# Patient Record
Sex: Female | Born: 1985 | Race: White | Hispanic: No | Marital: Married | Smoking: Former smoker
Health system: Southern US, Community
[De-identification: ages and names within clinical notes are randomized; demographics above are authoritative.]

## PROBLEM LIST (undated history)

## (undated) DIAGNOSIS — M810 Age-related osteoporosis without current pathological fracture: Secondary | ICD-10-CM

## (undated) DIAGNOSIS — F411 Generalized anxiety disorder: Secondary | ICD-10-CM

## (undated) DIAGNOSIS — M1990 Unspecified osteoarthritis, unspecified site: Secondary | ICD-10-CM

## (undated) DIAGNOSIS — K219 Gastro-esophageal reflux disease without esophagitis: Secondary | ICD-10-CM

## (undated) DIAGNOSIS — R55 Syncope and collapse: Secondary | ICD-10-CM

## (undated) DIAGNOSIS — M419 Scoliosis, unspecified: Secondary | ICD-10-CM

## (undated) DIAGNOSIS — D689 Coagulation defect, unspecified: Secondary | ICD-10-CM

## (undated) DIAGNOSIS — IMO0002 Reserved for concepts with insufficient information to code with codable children: Secondary | ICD-10-CM

## (undated) DIAGNOSIS — I219 Acute myocardial infarction, unspecified: Secondary | ICD-10-CM

## (undated) DIAGNOSIS — F32A Depression, unspecified: Secondary | ICD-10-CM

## (undated) DIAGNOSIS — N189 Chronic kidney disease, unspecified: Secondary | ICD-10-CM

## (undated) DIAGNOSIS — I10 Essential (primary) hypertension: Secondary | ICD-10-CM

## (undated) DIAGNOSIS — J449 Chronic obstructive pulmonary disease, unspecified: Secondary | ICD-10-CM

## (undated) DIAGNOSIS — I639 Cerebral infarction, unspecified: Secondary | ICD-10-CM

## (undated) DIAGNOSIS — F4310 Post-traumatic stress disorder, unspecified: Secondary | ICD-10-CM

## (undated) DIAGNOSIS — I499 Cardiac arrhythmia, unspecified: Secondary | ICD-10-CM

## (undated) DIAGNOSIS — T7840XA Allergy, unspecified, initial encounter: Secondary | ICD-10-CM

## (undated) DIAGNOSIS — G43909 Migraine, unspecified, not intractable, without status migrainosus: Secondary | ICD-10-CM

## (undated) DIAGNOSIS — J45909 Unspecified asthma, uncomplicated: Secondary | ICD-10-CM

## (undated) DIAGNOSIS — M797 Fibromyalgia: Secondary | ICD-10-CM

## (undated) DIAGNOSIS — G8929 Other chronic pain: Secondary | ICD-10-CM

## (undated) HISTORY — DX: Unspecified asthma, uncomplicated: J45.909

## (undated) HISTORY — DX: Syncope and collapse: R55

## (undated) HISTORY — DX: Depression, unspecified: F32.A

## (undated) HISTORY — DX: Age-related osteoporosis without current pathological fracture: M81.0

## (undated) HISTORY — DX: Allergy, unspecified, initial encounter: T78.40XA

## (undated) HISTORY — DX: Coagulation defect, unspecified: D68.9

## (undated) HISTORY — DX: Unspecified osteoarthritis, unspecified site: M19.90

## (undated) HISTORY — DX: Chronic kidney disease, unspecified: N18.9

## (undated) HISTORY — PX: CHOLECYSTECTOMY: SHX55

## (undated) HISTORY — DX: Cerebral infarction, unspecified: I63.9

## (undated) HISTORY — DX: Essential (primary) hypertension: I10

## (undated) HISTORY — PX: TUBAL LIGATION: SHX77

## (undated) HISTORY — DX: Chronic obstructive pulmonary disease, unspecified: J44.9

## (undated) HISTORY — DX: Cardiac arrhythmia, unspecified: I49.9

## (undated) HISTORY — DX: Gastro-esophageal reflux disease without esophagitis: K21.9

## (undated) HISTORY — DX: Acute myocardial infarction, unspecified: I21.9

## (undated) HISTORY — DX: Reserved for concepts with insufficient information to code with codable children: IMO0002

---
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
## 2009-12-16 DIAGNOSIS — F4312 Post-traumatic stress disorder, chronic: Secondary | ICD-10-CM | POA: Insufficient documentation

## 2015-03-13 DIAGNOSIS — F410 Panic disorder [episodic paroxysmal anxiety] without agoraphobia: Secondary | ICD-10-CM | POA: Insufficient documentation

## 2015-03-13 DIAGNOSIS — F488 Other specified nonpsychotic mental disorders: Secondary | ICD-10-CM | POA: Insufficient documentation

## 2015-03-13 DIAGNOSIS — G47419 Narcolepsy without cataplexy: Secondary | ICD-10-CM | POA: Insufficient documentation

## 2016-08-06 DIAGNOSIS — F329 Major depressive disorder, single episode, unspecified: Secondary | ICD-10-CM | POA: Insufficient documentation

## 2017-05-26 DIAGNOSIS — G43709 Chronic migraine without aura, not intractable, without status migrainosus: Secondary | ICD-10-CM | POA: Insufficient documentation

## 2017-07-03 LAB — HM PAP SMEAR: HM Pap smear: NORMAL

## 2021-02-03 ENCOUNTER — Other Ambulatory Visit: Payer: Self-pay

## 2021-02-03 ENCOUNTER — Emergency Department (INDEPENDENT_AMBULATORY_CARE_PROVIDER_SITE_OTHER)
Admission: EM | Admit: 2021-02-03 | Discharge: 2021-02-03 | Disposition: A | Discharge status: Home / Self Care | Payer: Managed Care, Other (non HMO) | Source: Home / Self Care | Attending: Family Medicine | Admitting: Family Medicine

## 2021-02-03 ENCOUNTER — Emergency Department: Payer: Managed Care, Other (non HMO)

## 2021-02-03 DIAGNOSIS — S59912A Unspecified injury of left forearm, initial encounter: Secondary | ICD-10-CM

## 2021-02-03 DIAGNOSIS — S5012XA Contusion of left forearm, initial encounter: Secondary | ICD-10-CM | POA: Diagnosis not present

## 2021-02-03 DIAGNOSIS — S6992XA Unspecified injury of left wrist, hand and finger(s), initial encounter: Secondary | ICD-10-CM | POA: Diagnosis not present

## 2021-02-03 DIAGNOSIS — M778 Other enthesopathies, not elsewhere classified: Secondary | ICD-10-CM

## 2021-02-03 HISTORY — DX: Scoliosis, unspecified: M41.9

## 2021-02-03 HISTORY — DX: Post-traumatic stress disorder, unspecified: F43.10

## 2021-02-03 HISTORY — DX: Fibromyalgia: M79.7

## 2021-02-03 HISTORY — DX: Generalized anxiety disorder: F41.1

## 2021-02-03 HISTORY — DX: Migraine, unspecified, not intractable, without status migrainosus: G43.909

## 2021-02-03 HISTORY — DX: Other chronic pain: G89.29

## 2021-02-03 MED ORDER — PREDNISONE 20 MG PO TABS: ORAL_TABLET | ORAL | 0 refills | Status: DC

## 2021-02-03 NOTE — Discharge Instructions (Addendum)
May continue to apply ice pack once or twice daily.  Elevate.  Wear brace for about 10 to 14 days.  Begin range of motion and stretching exercises as tolerated.

## 2021-02-03 NOTE — ED Provider Notes (Signed)
[facility]    CSN: [id number] Arrival date & time: 02/03/21  1038      History   Chief Complaint Chief Complaint  Patient presents with   Arm Injury    Left arm injury. X2 weeks    HPI Beth Lane is a 35 y.o. female.   Patient reports that the top edge of a heavy chest of drawers fell against the patient's left forearm and wrist two weeks ago.  She continues to have pain with wrist movement although the swelling has decreased.  The history is provided by the patient.  Arm Injury Location:  Arm and wrist Arm location:  L forearm Wrist location:  L wrist Injury: yes   Mechanism of injury: crush   Crush injury:    Mechanism: falling furniture. Pain details:    Quality:  Aching   Radiates to:  L wrist   Severity:  Moderate   Onset quality:  Sudden   Duration:  2 weeks   Timing:  Constant   Progression:  Unchanged Handedness:  Right-handed Prior injury to area:  No Worsened by:  Movement Ineffective treatments:  Ice Associated symptoms: decreased range of motion, muscle weakness, numbness and swelling    Past Medical History:  Diagnosis Date   Chronic pain    Fibromyalgia    Generalized anxiety disorder    Migraine syndrome    PTSD (post-traumatic stress disorder)    Scoliosis     There are no problems to display for this patient.   Past Surgical History:  Procedure Laterality Date   CESAREAN SECTION     CHOLECYSTECTOMY      OB History   No obstetric history on file.      Home Medications    Prior to Admission medications   Medication Sig Start Date End Date Taking? Authorizing Provider  albuterol (VENTOLIN HFA) 108 (90 Base) MCG/ACT inhaler Inhale into the lungs. 05/27/16  Yes [provider]  Diclofenac Potassium,Migraine, 50 MG PACK Take by mouth. 10/12/18  Yes [provider]  fluticasone-salmeterol (ADVAIR) 250-50 MCG/ACT AEPB USE 1 PUFF EVERY 12 HOURS 04/19/19  Yes [provider]   Fremanezumab-vfrm (AJOVY) 225 MG/1.5ML SOSY Inject into the skin. 10/12/18  Yes [provider]  methocarbamol (ROBAXIN) 750 MG tablet Take 750 mg by mouth every 6 (six) hours as needed. 01/09/21  Yes [provider]  Oxycodone HCl 10 MG TABS Take 10 mg by mouth every 6 (six) hours as needed. 01/16/21  Yes [provider]  PARoxetine (PAXIL) 30 MG tablet Take 30 mg by mouth daily. 01/09/21  Yes [provider]  predniSONE (DELTASONE) 20 MG tablet Take one tab by mouth twice daily for 4 days, then one daily. Take with food. 02/03/21  Yes Lattie Haw, MD  pregabalin (LYRICA) 100 MG capsule Take 100 mg by mouth every 8 (eight) hours. 01/18/21  Yes [provider]  VYVANSE 40 MG capsule Take 40 mg by mouth every morning. 09/05/20  Yes [provider]    Family History Family History  Problem Relation Age of Onset   Hypertension Mother    Cancer Mother    Bipolar disorder Mother    Fibromyalgia Mother    Stroke Father    Post-traumatic stress disorder Father    Heart attack Father     Social History Social History   Tobacco Use   Smoking status: Never   Smokeless tobacco: Never  Vaping Use   Vaping Use: Every day  Substance Use Topics   Alcohol use: Never   Drug use: Never     Allergies   Sumatriptan and Tape   Review of Systems Review of Systems  Constitutional: Negative.   Musculoskeletal:        Left wrist and forearm pain.  All other systems reviewed and are negative.   Physical Exam Triage Vital Signs ED Triage Vitals  Enc Vitals Group     BP 02/03/21 1408 117/85     Pulse Rate 02/03/21 1408 86     Resp 02/03/21 1408 18     Temp 02/03/21 1408 98.2 F (36.8 C)     Temp Source 02/03/21 1408 Oral     SpO2 02/03/21 1408 99 %     Weight 02/03/21 1402 248 lb (112.5 kg)     Height 02/03/21 1402 5\' 6"  (1.676 m)     Head Circumference --      Peak Flow --      Pain Score 02/03/21 1402 6     Pain Loc --       Pain Edu? --      Excl. in GC? --    No data found.  Updated Vital Signs BP 117/85 (BP Location: Right Arm)    Pulse 86    Temp 98.2 F (36.8 C) (Oral)    Resp 18    Ht 5\' 6"  (1.676 m)    Wt 112.5 kg    LMP 01/15/2021    SpO2 99%    BMI 40.03 kg/m   Visual Acuity Right Eye Distance:   Left Eye Distance:   Bilateral Distance:    Right Eye Near:   Left Eye Near:    Bilateral Near:     Physical Exam Vitals and nursing note reviewed.  Constitutional:      General: She is not in acute distress. HENT:     Head: Normocephalic.  Eyes:     Conjunctiva/sclera: Conjunctivae normal.     Pupils: Pupils are equal, round, and reactive to light.  Cardiovascular:     Rate and Rhythm: Normal rate.  Pulmonary:     Effort: Pulmonary effort is normal.  Musculoskeletal:       Arms:       Hands:     Comments: Left distal forearm has tenderness to palpation along its radial aspect.  Tenderness extends to the proximal thumb extensor tendons.  Left wrist has mildly decreased range of motion.  Distal neurovascular function is intact.   Skin:    General: Skin is warm and dry.     Findings: No rash.  Neurological:     General: No focal deficit present.     Mental Status: She is alert.     UC Treatments / Results  Labs (all labs ordered are listed, but only abnormal results are displayed) Labs Reviewed - No data to display  EKG   Radiology DG Forearm Left  Result Date: 02/03/2021 CLINICAL DATA:  Arm injury EXAM: LEFT FOREARM - 2 VIEW COMPARISON:  None. FINDINGS: There is no evidence of fracture or other focal bone lesions. Soft tissues are unremarkable. IMPRESSION: No acute osseous abnormality identified. Electronically Signed   By: 14/02/2020 M.D.   On: 02/03/2021 15:09   DG Wrist Complete Left  Result Date: 02/03/2021 CLINICAL DATA:  Arm injury EXAM: LEFT WRIST - COMPLETE 3+ VIEW COMPARISON:  None. FINDINGS: There is no evidence of fracture or dislocation. There is no  evidence of arthropathy or other focal bone  abnormality. Soft tissues are unremarkable. IMPRESSION: No acute osseous abnormality identified. Electronically Signed   By: Jannifer Hick M.D.   On: 02/03/2021 15:08    Procedures Procedures (including critical care time)  Medications Ordered in UC Medications - No data to display  Initial Impression / Assessment and Plan / UC Course  I have reviewed the triage vital signs and the nursing notes.  Pertinent labs & imaging results that were available during my care of the patient were reviewed by me and considered in my medical decision making (see chart for details).    Dispensed thumb spica splint.  Begin prednisone burst/taper. Given treatment instructions with range of motion and stretching exercises.  Followup with Dr. Rodney Langton ([facility]) if not improving about two weeks.   Final Clinical Impressions(s) / UC Diagnoses   Final diagnoses:  Contusion of left forearm, initial encounter  Tendonitis of wrist, left     Discharge Instructions      May continue to apply ice pack once or twice daily.  Elevate.  Wear brace for about 10 to 14 days.  Begin range of motion and stretching exercises as tolerated.     ED Prescriptions     Medication Sig Dispense Auth. Provider   predniSONE (DELTASONE) 20 MG tablet Take one tab by mouth twice daily for 4 days, then one daily. Take with food. 12 tablet Lattie Haw, MD         Lattie Haw, MD 02/05/21 [id number]

## 2021-02-03 NOTE — ED Triage Notes (Signed)
Pt states that a dresser fell on her left arm 2 weeks ago.   Pt states that she can't lift anything with left arm.

## 2021-03-08 DIAGNOSIS — J45909 Unspecified asthma, uncomplicated: Secondary | ICD-10-CM | POA: Insufficient documentation

## 2021-03-11 ENCOUNTER — Ambulatory Visit: Payer: Managed Care, Other (non HMO)

## 2021-03-16 ENCOUNTER — Ambulatory Visit (INDEPENDENT_AMBULATORY_CARE_PROVIDER_SITE_OTHER): Payer: Managed Care, Other (non HMO) | Admitting: Medical-Surgical

## 2021-03-16 ENCOUNTER — Encounter: Payer: Self-pay | Admitting: Medical-Surgical

## 2021-03-16 ENCOUNTER — Other Ambulatory Visit: Payer: Self-pay

## 2021-03-16 VITALS — BP 118/79 | HR 80 | Resp 20 | Ht 66.0 in | Wt 250.0 lb

## 2021-03-16 DIAGNOSIS — M797 Fibromyalgia: Secondary | ICD-10-CM

## 2021-03-16 DIAGNOSIS — G47419 Narcolepsy without cataplexy: Secondary | ICD-10-CM

## 2021-03-16 DIAGNOSIS — J4540 Moderate persistent asthma, uncomplicated: Secondary | ICD-10-CM

## 2021-03-16 DIAGNOSIS — Z7689 Persons encountering health services in other specified circumstances: Secondary | ICD-10-CM | POA: Diagnosis not present

## 2021-03-16 DIAGNOSIS — G43909 Migraine, unspecified, not intractable, without status migrainosus: Secondary | ICD-10-CM | POA: Diagnosis not present

## 2021-03-16 DIAGNOSIS — Z01419 Encounter for gynecological examination (general) (routine) without abnormal findings: Secondary | ICD-10-CM

## 2021-03-16 DIAGNOSIS — F410 Panic disorder [episodic paroxysmal anxiety] without agoraphobia: Secondary | ICD-10-CM

## 2021-03-16 DIAGNOSIS — F4312 Post-traumatic stress disorder, chronic: Secondary | ICD-10-CM

## 2021-03-16 DIAGNOSIS — F429 Obsessive-compulsive disorder, unspecified: Secondary | ICD-10-CM

## 2021-03-16 DIAGNOSIS — F324 Major depressive disorder, single episode, in partial remission: Secondary | ICD-10-CM

## 2021-03-16 DIAGNOSIS — F411 Generalized anxiety disorder: Secondary | ICD-10-CM

## 2021-03-16 MED ORDER — PAROXETINE HCL 30 MG PO TABS: 30.0000 mg | ORAL_TABLET | Freq: Every day | ORAL | 1 refills | Status: DC

## 2021-03-16 MED ORDER — LISDEXAMFETAMINE DIMESYLATE 40 MG PO CAPS: 40.0000 mg | ORAL_CAPSULE | ORAL | 0 refills | Status: DC

## 2021-03-16 MED ORDER — FLUTICASONE-SALMETEROL 250-50 MCG/ACT IN AEPB: 1.0000 | INHALATION_SPRAY | Freq: Two times a day (BID) | RESPIRATORY_TRACT | 2 refills | Status: DC

## 2021-03-16 MED ORDER — PRAZOSIN HCL 1 MG PO CAPS: 3.0000 mg | ORAL_CAPSULE | Freq: Every day | ORAL | 1 refills | Status: DC

## 2021-03-16 MED ORDER — UBRELVY 50 MG PO TABS: 1.0000 | ORAL_TABLET | ORAL | 3 refills | Status: DC | PRN

## 2021-03-16 MED ORDER — AJOVY 225 MG/1.5ML ~~LOC~~ SOSY: 225.0000 mg | PREFILLED_SYRINGE | SUBCUTANEOUS | 2 refills | Status: DC

## 2021-03-16 MED ORDER — VYVANSE 40 MG PO CAPS: 40.0000 mg | ORAL_CAPSULE | Freq: Every morning | ORAL | 0 refills | Status: DC

## 2021-03-16 NOTE — Progress Notes (Signed)
New Patient Office Visit  Subjective:  Patient ID: Beth Lane, female    DOB: 20-Jul-1985  Age: 36 y.o. MRN: [id number]  CC:  Chief Complaint  Patient presents with   Establish Care     HPI Beth Lane presents to establish care. She is a pleasant 36 year old female who is accompanied by her wife. She has relocated to the area and is in need of establishing with all new providers.   ADHD/Narcolepsy- treating with Vyvanse 40mg  daily but has been out of this for a while. Notes that her anxiety is extremely high when she doesn't take the Vyvanse which leads to her increased vaping.   Mood- taking Paxil 30mg  daily but has also run our to this medication. Tolerates well without side effects. Has been on the medication long term and reports it usually works well for her. Uses Prazosin 3mg  nightly for PTSD. Denies SI/HI.  Migraines- previously followed by Neurology. Uses Ajovy injections monthly and Ubrelvy as needed. Has been out of both medications for a while.   Pain- has fibromyalgia and severe chronic pain. Has been seeing a pain clinic but it is now a 3 hour drive away. Would like to get a referral to a pain clinic in this area. Using Oxycocone, Lyrica, and Robaxin as prescribed. Would like a referral to physical therapy. Doing exercises regularly but still having trouble with intractable pain.   Would like a referral to OB/GYN for her well woman care.   Past Medical History:  Diagnosis Date   Allergy    Imatrex and plastic tape   Arthritis    In knees, back, wrist, neck   Asthma    At age 71   Chronic pain    Clotting disorder (HCC)    Rectal bleeding at times, hemroids, ulcer   COPD (chronic obstructive pulmonary disease) (HCC)    Chronic bronchitis after being diagnosed with asthma at age 87   Depression    At age 7 but worse after my son died in 06-03-09   Fibromyalgia    Generalized anxiety disorder    GERD (gastroesophageal reflux disease)     Hypertension    About 4 years ago   Migraine syndrome    Osteoporosis    I tend to fracture bones easily and doctor wanted a bone density test in 06/04/10 but found out I was pregnant and was never followed up with   PTSD (post-traumatic stress disorder)    Scoliosis    Stroke (HCC)    Body reacted as a stroke but not an actual stroke. 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 Lost feeling and control on right side   Ulcer     Past Surgical History:  Procedure Laterality Date   CESAREAN SECTION     CHOLECYSTECTOMY     TUBAL LIGATION      Family History  Problem Relation Age of Onset   Hypertension Mother    Cancer Mother    Bipolar disorder Mother    Fibromyalgia Mother    ADD / ADHD Mother    Anxiety disorder Mother    Arthritis Mother    COPD Mother    Depression Mother    Diabetes Mother    Obesity Mother    Stroke Father    Post-traumatic stress disorder Father    Heart attack Father    ADD / ADHD Father    Anxiety disorder Father    Asthma Father    Alcohol abuse  Maternal Grandfather    ADD / ADHD Daughter    ADD / ADHD Son    Anxiety disorder Son    ADD / ADHD Brother    ADD / ADHD Sister    Anxiety disorder Son    Intellectual disability Son    Anxiety disorder Son    Hearing loss Son    Learning disabilities Son    Early death Son    Learning disabilities Son    Miscarriages / [place] Sister     Social History   Socioeconomic History   Marital status: Married    Spouse name: Not on file   Number of children: Not on file   Years of education: Not on file   Highest education level: Not on file  Occupational History   Not on file  Tobacco Use   Smoking status: Every Day    Types: E-cigarettes   Smokeless tobacco: Never  Vaping Use   Vaping Use: Every day  Substance and Sexual Activity   Alcohol use: Never   Drug use: Never   Sexual activity: Yes    Birth control/protection: Other-see comments    Comment: Lesbian  Other  Topics Concern   Not on file  Social History Narrative   Not on file   Social Determinants of Health   Financial Resource Strain: Not on file  Food Insecurity: Not on file  Transportation Needs: Not on file  Physical Activity: Not on file  Stress: Not on file  Social Connections: Not on file  Intimate Partner Violence: Not on file    ROS Review of Systems  Constitutional:  Negative for chills, fatigue, fever and unexpected weight change.  Respiratory:  Negative for cough, chest tightness and shortness of breath.   Cardiovascular:  Negative for chest pain, palpitations and leg swelling.  Gastrointestinal:  Negative for abdominal pain, constipation, diarrhea, nausea and vomiting.  Endocrine: Negative for cold intolerance and heat intolerance.  Genitourinary:  Positive for dysuria. Negative for frequency and urgency.  Skin:  Negative for rash and wound.  Neurological:  Negative for dizziness, light-headedness and headaches.  Hematological:  Does not bruise/bleed easily.  Psychiatric/Behavioral:  Positive for dysphoric mood. Negative for self-injury, sleep disturbance and suicidal ideas. The patient is nervous/anxious.    Objective:   Today's Vitals: BP 118/79 (BP Location: Left Wrist, Patient Position: Sitting, Cuff Size: Normal)    Pulse 80    Resp 20    Ht 5\' 6"  (1.676 m)    Wt 250 lb (113.4 kg)    SpO2 98%    BMI 40.35 kg/m   Physical Exam Vitals reviewed.  Constitutional:      General: She is not in acute distress.    Appearance: Normal appearance. She is obese. She is not ill-appearing.  HENT:     Head: Normocephalic and atraumatic.  Cardiovascular:     Rate and Rhythm: Normal rate and regular rhythm.     Pulses: Normal pulses.     Heart sounds: Normal heart sounds. No murmur heard.   No friction rub. No gallop.  Pulmonary:     Effort: Pulmonary effort is normal. No respiratory distress.     Breath sounds: Normal breath sounds. No wheezing.  Skin:    General: Skin  is warm and dry.  Neurological:     Mental Status: She is alert and oriented to person, place, and time.  Psychiatric:        Mood and Affect: Mood normal.  Behavior: Behavior normal.        Thought Content: Thought content normal.        Judgment: Judgment normal.    Assessment & Plan:   1. Encounter to establish care Reviewed available information and discussed care concerns with patient.   2. Primary narcolepsy without cataplexy Referrals as below.  Continue Vyvanse 40 mg daily.  Refills sent to pharmacy. - Ambulatory referral to Behavioral Health - Ambulatory referral to Psychiatry - Ambulatory referral to Neurology  3. Migraine syndrome Referring to neurology.  Refilling Ajovy and Bernita RaisinUbrelvy. - Ambulatory referral to Neurology  4. Major depressive disorder with single episode, in partial remission (HCC) 5. Chronic post-traumatic stress disorder (PTSD) 6. Obsessive-compulsive disorder, unspecified type 7. Generalized anxiety disorder with panic attacks Continue Paxil 30 mg daily and prazosin 3 mg nightly.  Referring to psychiatry as well as counseling per patient request. - Ambulatory referral to Behavioral Health - Ambulatory referral to Psychiatry  8. Fibromyalgia affecting multiple sites Referrals to pain clinic and physical therapy. - Ambulatory referral to Physical Therapy - Ambulatory referral to Pain Clinic  9. Well woman exam Referring to OB/GYN per patient request.  Advised her that we are able to do most well woman care here in our office but she is welcome to a specialist if desired. - Ambulatory referral to Obstetrics / Gynecology  10. Moderate persistent asthma without complication Continue Advair and albuterol as prescribed.  Refills sent to pharmacy.  Outpatient Encounter Medications as of 03/16/2021  Medication Sig   esomeprazole (NEXIUM) 40 MG capsule Take 40 mg by mouth daily at 12 noon.   fluticasone-salmeterol (ADVAIR DISKUS) 250-50 MCG/ACT  AEPB Inhale 1 puff into the lungs in the morning and at bedtime.   [START ON 04/15/2021] lisdexamfetamine (VYVANSE) 40 MG capsule Take 1 capsule (40 mg total) by mouth every morning.   [START ON 05/15/2021] lisdexamfetamine (VYVANSE) 40 MG capsule Take 1 capsule (40 mg total) by mouth every morning.   methocarbamol (ROBAXIN) 750 MG tablet Take 750 mg by mouth every 6 (six) hours as needed.   MOVANTIK 25 MG TABS tablet Take 25 mg by mouth every morning.   Oxycodone HCl 10 MG TABS Take 10 mg by mouth every 6 (six) hours as needed.   pregabalin (LYRICA) 100 MG capsule Take 100 mg by mouth every 8 (eight) hours.   Ubrogepant (UBRELVY) 50 MG TABS Take 1 tablet by mouth as needed (for migraine).   [DISCONTINUED] Fremanezumab-vfrm (AJOVY) 225 MG/1.5ML SOSY Inject into the skin.   [DISCONTINUED] PARoxetine (PAXIL) 30 MG tablet Take 30 mg by mouth daily.   [DISCONTINUED] prazosin (MINIPRESS) 1 MG capsule Take 3 mg by mouth at bedtime.   [DISCONTINUED] VYVANSE 40 MG capsule Take 40 mg by mouth every morning.   Fremanezumab-vfrm (AJOVY) 225 MG/1.5ML SOSY Inject 225 mg into the skin every 30 (thirty) days.   PARoxetine (PAXIL) 30 MG tablet Take 1 tablet (30 mg total) by mouth daily.   prazosin (MINIPRESS) 1 MG capsule Take 3 capsules (3 mg total) by mouth at bedtime.   VYVANSE 40 MG capsule Take 1 capsule (40 mg total) by mouth every morning.   [DISCONTINUED] albuterol (VENTOLIN HFA) 108 (90 Base) MCG/ACT inhaler Inhale into the lungs.   [DISCONTINUED] Diclofenac Potassium,Migraine, 50 MG PACK Take by mouth.   [DISCONTINUED] fluticasone-salmeterol (ADVAIR) 250-50 MCG/ACT AEPB USE 1 PUFF EVERY 12 HOURS   [DISCONTINUED] predniSONE (DELTASONE) 20 MG tablet Take one tab by mouth twice daily for 4 days, then one  daily. Take with food.   No facility-administered encounter medications on file as of 03/16/2021.    Follow-up: Return in about 3 months (around 06/14/2021) for chronic disease follow up.   Thayer OhmJoy L.  Oaklie Durrett, DNP, APRN, FNP-BC [facility]

## 2021-03-17 ENCOUNTER — Other Ambulatory Visit: Payer: Self-pay

## 2021-03-17 ENCOUNTER — Other Ambulatory Visit: Payer: Self-pay | Admitting: Medical-Surgical

## 2021-03-17 DIAGNOSIS — R300 Dysuria: Secondary | ICD-10-CM

## 2021-03-17 MED ORDER — NITROFURANTOIN MONOHYD MACRO 100 MG PO CAPS: 100.0000 mg | ORAL_CAPSULE | Freq: Two times a day (BID) | ORAL | 0 refills | Status: DC

## 2021-03-19 LAB — URINE CULTURE
MICRO NUMBER:: 12946640
SPECIMEN QUALITY:: ADEQUATE

## 2021-03-24 ENCOUNTER — Ambulatory Visit: Payer: Managed Care, Other (non HMO) | Attending: Medical-Surgical | Admitting: Physical Therapy

## 2021-03-25 ENCOUNTER — Telehealth: Payer: Self-pay

## 2021-03-25 NOTE — Telephone Encounter (Addendum)
Initiated Prior authorization [id number] (AJOVY) 225 MG/1.5ML SOSY Via: [place] [phone]) [id number] Status:approved as of 03/25/21 Reason: approval dates 03/25/2021-03/20/2022 Notified Pt via: Mychart [id number]

## 2021-03-27 NOTE — Telephone Encounter (Signed)
LVM for patient to call back to get appt scheduled.

## 2021-04-03 ENCOUNTER — Ambulatory Visit: Payer: Managed Care, Other (non HMO) | Admitting: Medical-Surgical

## 2021-04-06 ENCOUNTER — Other Ambulatory Visit: Payer: Self-pay

## 2021-04-06 ENCOUNTER — Ambulatory Visit: Payer: Managed Care, Other (non HMO)

## 2021-04-06 ENCOUNTER — Encounter: Payer: Self-pay | Admitting: Medical-Surgical

## 2021-04-06 ENCOUNTER — Ambulatory Visit (INDEPENDENT_AMBULATORY_CARE_PROVIDER_SITE_OTHER): Payer: Managed Care, Other (non HMO) | Admitting: Medical-Surgical

## 2021-04-06 ENCOUNTER — Other Ambulatory Visit: Payer: Self-pay | Admitting: Medical-Surgical

## 2021-04-06 VITALS — BP 118/80 | HR 102 | Temp 98.1°F | Ht 66.0 in | Wt 235.0 lb

## 2021-04-06 DIAGNOSIS — R109 Unspecified abdominal pain: Secondary | ICD-10-CM

## 2021-04-06 DIAGNOSIS — R300 Dysuria: Secondary | ICD-10-CM

## 2021-04-06 DIAGNOSIS — Z720 Tobacco use: Secondary | ICD-10-CM | POA: Insufficient documentation

## 2021-04-06 LAB — WET PREP FOR TRICH, YEAST, CLUE
MICRO NUMBER:: 13030955
Specimen Quality: ADEQUATE

## 2021-04-06 LAB — POCT URINALYSIS DIP (CLINITEK)
Bilirubin, UA: NEGATIVE
Blood, UA: NEGATIVE
Glucose, UA: NEGATIVE mg/dL
Nitrite, UA: POSITIVE — AB
Spec Grav, UA: >=1.03 — AB (ref 1.010–1.025)
Urobilinogen, UA: 0.2 E.U./dL
pH, UA: 5.5 (ref 5.0–8.0)

## 2021-04-06 MED ORDER — SULFAMETHOXAZOLE-TRIMETHOPRIM 800-160 MG PO TABS: 1.0000 | ORAL_TABLET | Freq: Two times a day (BID) | ORAL | 0 refills | Status: DC

## 2021-04-06 MED ORDER — ESOMEPRAZOLE MAGNESIUM 40 MG PO CPDR: 40.0000 mg | DELAYED_RELEASE_CAPSULE | Freq: Every day | ORAL | 2 refills | Status: DC

## 2021-04-06 NOTE — Progress Notes (Signed)
°  HPI with pertinent ROS:   CC: dysuria, back pain  HPI: Pleasant 36 year old female presenting today for evaluation of dysuria and back/flank pain.  Recently treated with Macrobid for a UTI, culture positive for E. coli showing sensitive to [place].  She completed her course of Macrobid and notes that her urinary symptoms did resolve but she is now left with a sharp stabbing pain when she initially starts urinating.  She has trouble emptying her bladder as well as some hesitancy, frequency, urgency, and foul odor to her urine.  Notes that she has seen her urine anywhere from yellow to dark orange with frequent color changes.  She has bilateral flank pain that is worse on the left.  Has been using a heating pad but is staying on it too long and now has discoloration of the skin along her lower back.  Denies fever, chills, shortness of breath, chest pain, or vaginal discharge.  History of kidney stones, no blood in urine noted.  I reviewed the past medical history, family history, social history, surgical history, and allergies today and no changes were needed.  Please see the problem list section below in epic for further details.  Physical exam:   General: Well Developed, well nourished, and in no acute distress.  Neuro: Alert and oriented x3.  HEENT: Normocephalic, atraumatic.  Skin: Warm and dry.  Darkened mottling along the lower back bilaterally, no blistering noted. Cardiac: Regular rate and rhythm, no murmurs rubs or gallops, no lower extremity edema.  Respiratory: Clear to auscultation bilaterally. Not using accessory muscles, speaking in full sentences.  Impression and Recommendations:    1. Dysuria 2. Flank pain POCT UA-positive for nitrites and small leukocytes, negative for blood, elevated specific gravity.  Trace protein.  Treating as pyelonephritis with Bactrim twice daily for 7 days.  May extend course to 14 days depending on culture results.  Sending for culture. Checking labs  and wet prep. Getting CT renal stone study STAT.  - POCT Urinalysis Dipstick - Urine Culture - CT RENAL STONE STUDY; Future - CBC with Differential - COMPLETE METABOLIC PANEL WITH GFR - WET PREP FOR TRICH, YEAST, CLUE  Return if symptoms worsen or fail to improve. ___________________________________________ Clearnce Sorrel, DNP, APRN, FNP-BC [facility] [place]

## 2021-04-06 NOTE — Addendum Note (Signed)
Addended byChristen Butter on: 04/06/2021 05:08 PM   Modules accepted: Orders

## 2021-04-07 ENCOUNTER — Encounter: Payer: Self-pay | Admitting: Medical-Surgical

## 2021-04-07 ENCOUNTER — Other Ambulatory Visit: Payer: Self-pay | Admitting: Medical-Surgical

## 2021-04-07 DIAGNOSIS — K429 Umbilical hernia without obstruction or gangrene: Secondary | ICD-10-CM

## 2021-04-07 LAB — COMPLETE METABOLIC PANEL WITH GFR
AG Ratio: 1.6 (calc) (ref 1.0–2.5)
ALT: 13 U/L (ref 6–29)
AST: 11 U/L (ref 10–30)
Albumin: 4.2 g/dL (ref 3.6–5.1)
Alkaline phosphatase (APISO): 59 U/L (ref 31–125)
BUN: 13 mg/dL (ref 7–25)
CO2: 28 mmol/L (ref 20–32)
Calcium: 9.4 mg/dL (ref 8.6–10.2)
Chloride: 106 mmol/L (ref 98–110)
Creat: 0.71 mg/dL (ref 0.50–0.97)
Globulin: 2.6 g/dL (calc) (ref 1.9–3.7)
Glucose, Bld: 77 mg/dL (ref 65–99)
Potassium: 4 mmol/L (ref 3.5–5.3)
Sodium: 141 mmol/L (ref 135–146)
Total Bilirubin: 0.5 mg/dL (ref 0.2–1.2)
Total Protein: 6.8 g/dL (ref 6.1–8.1)
eGFR: 115 mL/min/{1.73_m2} (ref >=60)

## 2021-04-07 LAB — CBC WITH DIFFERENTIAL/PLATELET
Absolute Monocytes: 406 cells/uL (ref 200–950)
Basophils Absolute: 42 cells/uL (ref 0–200)
Basophils Relative: 0.8 %
Eosinophils Absolute: 42 cells/uL (ref 15–500)
Eosinophils Relative: 0.8 %
HCT: 38.3 % (ref 35.0–45.0)
Hemoglobin: 13.1 g/dL (ref 11.7–15.5)
Lymphs Abs: 2356 cells/uL (ref 850–3900)
MCH: 31.1 pg (ref 27.0–33.0)
MCHC: 34.2 g/dL (ref 32.0–36.0)
MCV: 91 fL (ref 80.0–100.0)
MPV: 11.6 fL (ref 7.5–12.5)
Monocytes Relative: 7.8 %
Neutro Abs: 2356 cells/uL (ref 1500–7800)
Neutrophils Relative %: 45.3 %
Platelets: 246 10*3/uL (ref 140–400)
RBC: 4.21 10*6/uL (ref 3.80–5.10)
RDW: 13 % (ref 11.0–15.0)
Total Lymphocyte: 45.3 %
WBC: 5.2 10*3/uL (ref 3.8–10.8)

## 2021-04-07 MED ORDER — METRONIDAZOLE 500 MG PO TABS: 500.0000 mg | ORAL_TABLET | Freq: Two times a day (BID) | ORAL | 0 refills | Status: AC

## 2021-04-07 NOTE — Addendum Note (Signed)
Addended byChristen Butter on: 04/07/2021 07:12 AM   Modules accepted: Orders

## 2021-04-08 LAB — URINE CULTURE
MICRO NUMBER:: 13034736
SPECIMEN QUALITY:: ADEQUATE

## 2021-04-10 ENCOUNTER — Encounter: Payer: Self-pay | Admitting: Medical-Surgical

## 2021-04-10 ENCOUNTER — Telehealth: Payer: Self-pay

## 2021-04-10 DIAGNOSIS — F4312 Post-traumatic stress disorder, chronic: Secondary | ICD-10-CM

## 2021-04-10 DIAGNOSIS — R300 Dysuria: Secondary | ICD-10-CM

## 2021-04-10 DIAGNOSIS — F324 Major depressive disorder, single episode, in partial remission: Secondary | ICD-10-CM

## 2021-04-10 DIAGNOSIS — R109 Unspecified abdominal pain: Secondary | ICD-10-CM

## 2021-04-10 DIAGNOSIS — F429 Obsessive-compulsive disorder, unspecified: Secondary | ICD-10-CM

## 2021-04-10 DIAGNOSIS — F410 Panic disorder [episodic paroxysmal anxiety] without agoraphobia: Secondary | ICD-10-CM

## 2021-04-10 NOTE — Telephone Encounter (Addendum)
Initiated Prior authorization [id number] 50MG  tablets Via:[place] tracks Case/Key:[id number] Status: denied  as of 04/10/21 Reason:expedited , denied not meeting criteria based on pt insurance policy,denial letter was sent out via certified mail for specifics   Notified Pt via: Mychart

## 2021-04-14 ENCOUNTER — Encounter: Payer: Managed Care, Other (non HMO) | Admitting: Obstetrics and Gynecology

## 2021-04-16 ENCOUNTER — Telehealth: Payer: Self-pay

## 2021-04-16 NOTE — Telephone Encounter (Addendum)
Initiated Prior authorization [id number] (UBRELVY) 50 MG TABS ?Via: [place] tracks ?Case/Key:[id number] ?Status: approved as of 04/16/21 ?Reason: ?Notified Pt via: Mychart ?

## 2021-04-21 ENCOUNTER — Other Ambulatory Visit: Payer: Self-pay | Admitting: Medical-Surgical

## 2021-04-21 ENCOUNTER — Encounter: Payer: Self-pay | Admitting: Medical-Surgical

## 2021-04-21 ENCOUNTER — Ambulatory Visit (INDEPENDENT_AMBULATORY_CARE_PROVIDER_SITE_OTHER): Payer: Managed Care, Other (non HMO) | Admitting: Sports Medicine

## 2021-04-21 ENCOUNTER — Ambulatory Visit (INDEPENDENT_AMBULATORY_CARE_PROVIDER_SITE_OTHER): Payer: Managed Care, Other (non HMO) | Admitting: Medical-Surgical

## 2021-04-21 ENCOUNTER — Other Ambulatory Visit: Payer: Self-pay

## 2021-04-21 DIAGNOSIS — N390 Urinary tract infection, site not specified: Secondary | ICD-10-CM

## 2021-04-21 DIAGNOSIS — M797 Fibromyalgia: Secondary | ICD-10-CM | POA: Diagnosis not present

## 2021-04-21 DIAGNOSIS — M5030 Other cervical disc degeneration, unspecified cervical region: Secondary | ICD-10-CM

## 2021-04-21 MED ORDER — AMOXICILLIN-POT CLAVULANATE 875-125 MG PO TABS: 1.0000 | ORAL_TABLET | Freq: Two times a day (BID) | ORAL | 0 refills | Status: DC

## 2021-04-21 MED ORDER — CEFTRIAXONE SODIUM 1 G IJ SOLR
1.0000 g | Freq: Once | INTRAMUSCULAR | Status: AC
  Administered 2021-04-21: 1 g via INTRAMUSCULAR

## 2021-04-21 MED ORDER — METHOCARBAMOL 750 MG PO TABS: 750.0000 mg | ORAL_TABLET | Freq: Four times a day (QID) | ORAL | 3 refills | Status: DC | PRN

## 2021-04-21 MED ORDER — CEFTRIAXONE SODIUM 1 G IJ SOLR: 1.0000 g | Freq: Once | INTRAMUSCULAR | Status: DC

## 2021-04-21 MED ORDER — PREGABALIN 150 MG PO CAPS: 150.0000 mg | ORAL_CAPSULE | Freq: Three times a day (TID) | ORAL | 0 refills | Status: DC

## 2021-04-21 NOTE — Assessment & Plan Note (Signed)
This is a pleasant 36 year old female, she has had longstanding back pain for decades. ?She is working with a pain clinic, her pain has been moderately controlled with oxycodone, Lyrica, Robaxin. ?She did have a lumbar spine MRI recently that was completely unremarkable with the exception of some mild scoliosis. ?Scoliosis itself does not cause pain, but degenerative changes that can be seen alongside scoliosis can cause pain. ?In Chris's case there are no degenerative changes in the lumbar spine. ?She has also had multiple medications, facet injections, radiofrequency ablations, epidurals into the lumbar spine without improvement. ?I did explain the limitations in treatment of low back pain in the absence of structural abnormalities, and that the focus would be on neuropathic agents, core stabilization. ?I have advised her to focus aggressively on physical therapy, core conditioning, and avoidance of back braces. ?We will bump her Lyrica up to 150 mg 3 times daily, I will refill her Robaxin, she can continue to get her oxycodone prescriptions from her pain clinic. ?Return to see [place] in about a month. ?

## 2021-04-21 NOTE — Progress Notes (Signed)
? ?Established Patient Office Visit ? ?Subjective:  ?Patient ID: Beth Lane, female    DOB: [birth year]  Age: 36 y.o. MRN: [id number] ? ?CC:  ?Chief Complaint  ?Patient presents with  ? Injections  ? ? ?HPI ?Beth Lane presents for Rocephin injection.  ? ?Past Medical History:  ?Diagnosis Date  ? Allergy   ? Imatrex and plastic tape  ? Arthritis   ? In knees, back, wrist, neck  ? Asthma   ? At age 29  ? Chronic pain   ? Clotting disorder ([place])   ? Rectal bleeding at times, hemroids, ulcer  ? Depression   ? At age 65 but worse after my son died in 04/18/09  ? Fibromyalgia   ? Generalized anxiety disorder   ? GERD (gastroesophageal reflux disease)   ? Hypertension   ? About 4 years ago  ? Migraine syndrome   ? Osteoporosis   ? I tend to fracture bones easily and doctor wanted a bone density test in 2010/04/18 but found out I was pregnant and was never followed up with  ? PTSD (post-traumatic stress disorder)   ? Scoliosis   ? Stroke [facility]   ? Body reacted as a stroke but not an actual stroke. 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 Lost feeling and control on right side  ? Ulcer   ? ? ?Past Surgical History:  ?Procedure Laterality Date  ? CESAREAN SECTION    ? CHOLECYSTECTOMY    ? TUBAL LIGATION    ? ? ?Family History  ?Problem Relation Age of Onset  ? Hypertension Mother   ? Cancer Mother   ? Bipolar disorder Mother   ? Fibromyalgia Mother   ? ADD / ADHD Mother   ? Anxiety disorder Mother   ? Arthritis Mother   ? COPD Mother   ? Depression Mother   ? Diabetes Mother   ? Obesity Mother   ? Stroke Father   ? Post-traumatic stress disorder Father   ? Heart attack Father   ? ADD / ADHD Father   ? Anxiety disorder Father   ? Asthma Father   ? Alcohol abuse Maternal Grandfather   ? ADD / ADHD Daughter   ? ADD / ADHD Son   ? Anxiety disorder Son   ? ADD / ADHD Brother   ? ADD / ADHD Sister   ? Anxiety disorder Son   ? Intellectual disability Son   ? Anxiety disorder Son   ? Hearing loss Son    ? Learning disabilities Son   ? Early death Son   ? Learning disabilities Son   ? Miscarriages / Stillbirths Sister   ? ? ?Social History  ? ?Socioeconomic History  ? Marital status: Married  ?  Spouse name: Not on file  ? Number of children: Not on file  ? Years of education: Not on file  ? Highest education level: Not on file  ?Occupational History  ? Not on file  ?Tobacco Use  ? Smoking status: Every Day  ?  Types: E-cigarettes  ? Smokeless tobacco: Never  ?Vaping Use  ? Vaping Use: Every day  ?Substance and Sexual Activity  ? Alcohol use: Never  ? Drug use: Never  ? Sexual activity: Yes  ?  Birth control/protection: Other-see comments  ?  Comment: Lesbian  ?Other Topics Concern  ? Not on file  ?Social History Narrative  ? Not on file  ? ?Social Determinants of Health  ? ?Financial  Resource Strain: Not on file  ?Food Insecurity: Not on file  ?Transportation Needs: Not on file  ?Physical Activity: Not on file  ?Stress: Not on file  ?Social Connections: Not on file  ?Intimate Partner Violence: Not on file  ? ? ?Outpatient Medications Prior to Visit  ?Medication Sig Dispense Refill  ? amoxicillin-clavulanate (AUGMENTIN) 875-125 MG tablet Take 1 tablet by mouth 2 (two) times daily. 28 tablet 0  ? diclofenac Sodium (VOLTAREN) 1 % GEL SMARTSIG:2 Gram(s) Topical 3 Times Daily PRN    ? esomeprazole (NEXIUM) 40 MG capsule Take 1 capsule (40 mg total) by mouth daily at 12 noon. 30 capsule 2  ? fluticasone-salmeterol (ADVAIR DISKUS) 250-50 MCG/ACT AEPB Inhale 1 puff into the lungs in the morning and at bedtime. 60 each 2  ? Fremanezumab-vfrm (AJOVY) 225 MG/1.5ML SOSY Inject 225 mg into the skin every 30 (thirty) days. 1.5 mL 2  ? lidocaine-prilocaine (EMLA) cream SMARTSIG:2 Gram(s) Topical 3 Times Daily PRN    ? lisdexamfetamine (VYVANSE) 40 MG capsule Take 1 capsule (40 mg total) by mouth every morning. 30 capsule 0  ? [START ON 05/15/2021] lisdexamfetamine (VYVANSE) 40 MG capsule Take 1 capsule (40 mg total) by mouth  every morning. 30 capsule 0  ? methocarbamol (ROBAXIN) 750 MG tablet Take 1 tablet (750 mg total) by mouth every 6 (six) hours as needed. 360 tablet 3  ? MOVANTIK 25 MG TABS tablet Take 25 mg by mouth every morning.    ? Oxycodone HCl 10 MG TABS Take 10 mg by mouth every 6 (six) hours as needed.    ? PARoxetine (PAXIL) 30 MG tablet Take 1 tablet (30 mg total) by mouth daily. 90 tablet 1  ? prazosin (MINIPRESS) 1 MG capsule Take 3 capsules (3 mg total) by mouth at bedtime. 270 capsule 1  ? pregabalin (LYRICA) 150 MG capsule Take 1 capsule (150 mg total) by mouth 3 (three) times daily. 270 capsule 0  ? Ubrogepant (UBRELVY) 50 MG TABS Take 1 tablet by mouth as needed (for migraine). 10 tablet 3  ? VYVANSE 40 MG capsule Take 1 capsule (40 mg total) by mouth every morning. 30 capsule 0  ? cefTRIAXone (ROCEPHIN) injection 1 g     ? ?No facility-administered medications prior to visit.  ? ? ?Allergies  ?Allergen Reactions  ? Iodine Nausea And Vomiting  ?  Only allergic to Contrast media. Has severe vomiting ? ?  ? Sumatriptan Anaphylaxis  ? Tape Rash  ? ? ?ROS ?Review of Systems ? ?  ?Objective:  ?  ?Physical Exam ? ?There were no vitals taken for this visit. ?Wt Readings from Last 3 Encounters:  ?04/06/21 235 lb (106.6 kg)  ?03/16/21 250 lb (113.4 kg)  ?02/03/21 248 lb (112.5 kg)  ? ? ? ?There are no preventive care reminders to display for this patient. ? ?There are no preventive care reminders to display for this patient. ? ?No results found for: TSH ?Lab Results  ?Component Value Date  ? WBC 5.2 04/06/2021  ? HGB 13.1 04/06/2021  ? HCT 38.3 04/06/2021  ? MCV 91.0 04/06/2021  ? PLT 246 04/06/2021  ? ?Lab Results  ?Component Value Date  ? NA 141 04/06/2021  ? K 4.0 04/06/2021  ? CO2 28 04/06/2021  ? GLUCOSE 77 04/06/2021  ? BUN 13 04/06/2021  ? CREATININE 0.71 04/06/2021  ? BILITOT 0.5 04/06/2021  ? AST 11 04/06/2021  ? ALT 13 04/06/2021  ? PROT 6.8 04/06/2021  ? CALCIUM 9.4 04/06/2021  ?  EGFR 115 04/06/2021  ? ?No  results found for: CHOL ?No results found for: HDL ?No results found for: [place] ?No results found for: TRIG ?No results found for: CHOLHDL ?No results found for: HGBA1C ? ?  ?Assessment & Plan:  ?Rocephin injection - Patient tolerated injection well without complications. Patient advised to take the Augmentin as directed.  ? ?Problem List Items Addressed This Visit   ?None ?Visit Diagnoses   ? ? Complicated UTI (urinary tract infection)    -  Primary  ? Relevant Medications  ? cefTRIAXone (ROCEPHIN) injection 1 g (Completed)  ? ?  ? ? ?Meds ordered this encounter  ?Medications  ? cefTRIAXone (ROCEPHIN) injection 1 g  ? ? ?Follow-up: Return if symptoms worsen or fail to improve.  ? ? ?Lavell Luster, [place] ?

## 2021-04-21 NOTE — Assessment & Plan Note (Signed)
Beth Lane has neck pain with radiation into the right periscapular region. ?I think the majority of her pain is related to fibromyalgia however an MRI of the cervical spine from 2018 did show mild C5-T1 degenerative changes. ?For this reason we will try a right-sided C6-C7 interlaminar epidural. ?Continue Lyrica and Robaxin as below. ? ?

## 2021-04-21 NOTE — Progress Notes (Signed)
? ? ?  Procedures performed today:   ? ?None. ? ?Independent interpretation of notes and tests performed by another provider:  ? ?None. ? ?Brief History, Exam, Impression, and Recommendations:   ? ?Fibromyalgia affecting multiple sites ?This is a pleasant 36 year old female, she has had longstanding back pain for decades. ?She is working with a pain clinic, her pain has been moderately controlled with oxycodone, Lyrica, Robaxin. ?She did have a lumbar spine MRI recently that was completely unremarkable with the exception of some mild scoliosis. ?Scoliosis itself does not cause pain, but degenerative changes that can be seen alongside scoliosis can cause pain. ?In Beth Lane's case there are no degenerative changes in the lumbar spine. ?She has also had multiple medications, facet injections, radiofrequency ablations, epidurals into the lumbar spine without improvement. ?I did explain the limitations in treatment of low back pain in the absence of structural abnormalities, and that the focus would be on neuropathic agents, core stabilization. ?I have advised her to focus aggressively on physical therapy, core conditioning, and avoidance of back braces. ?We will bump her Lyrica up to 150 mg 3 times daily, I will refill her Robaxin, she can continue to get her oxycodone prescriptions from her pain clinic. ?Return to see [place] in about a month. ? ?DDD (degenerative disc disease), cervical ?Beth Lane has neck pain with radiation into the right periscapular region. ?I think the majority of her pain is related to fibromyalgia however an MRI of the cervical spine from 2018 did show mild C5-T1 degenerative changes. ?For this reason we will try a right-sided C6-C7 interlaminar epidural. ?Continue Lyrica and Robaxin as below. ? ?I spent 30 minutes of total time managing this patient today, this includes chart review, face to face, and non-face to face time.  Chronic process with exacerbation and pharmacologic  intervention ? ?___________________________________________ ?Ihor Austin. Benjamin Stain, M.D., ABFM., CAQSM. ?Primary Care and Sports Medicine ?[facility] ? ?Adjunct Instructor of Family Medicine  ?[employer] ?

## 2021-04-21 NOTE — Progress Notes (Signed)
Agree with documentation as above.  ? ?___________________________________________ ?Beth Haubner L. Ellaina Schuler, DNP, APRN, FNP-BC ?Primary Care and Sports Medicine ?[facility] ? ?

## 2021-04-22 ENCOUNTER — Encounter: Payer: Self-pay | Admitting: Sports Medicine

## 2021-04-27 ENCOUNTER — Encounter: Payer: Self-pay | Admitting: Sports Medicine

## 2021-04-27 DIAGNOSIS — M5030 Other cervical disc degeneration, unspecified cervical region: Secondary | ICD-10-CM

## 2021-04-29 ENCOUNTER — Ambulatory Visit
Admission: RE | Admit: 2021-04-29 | Discharge: 2021-04-29 | Disposition: A | Discharge status: Home / Self Care | Payer: Managed Care, Other (non HMO) | Source: Ambulatory Visit | Attending: Sports Medicine | Admitting: Sports Medicine

## 2021-04-29 ENCOUNTER — Other Ambulatory Visit: Payer: Self-pay

## 2021-04-29 DIAGNOSIS — M5030 Other cervical disc degeneration, unspecified cervical region: Secondary | ICD-10-CM

## 2021-04-29 MED ORDER — IOPAMIDOL (ISOVUE-M 300) INJECTION 61%
1.0000 mL | Freq: Once | INTRAMUSCULAR | Status: AC
Start: 2021-04-29 — End: 2021-04-29
  Administered 2021-04-29: 1 mL via EPIDURAL

## 2021-04-29 MED ORDER — TRIAMCINOLONE ACETONIDE 40 MG/ML IJ SUSP (RADIOLOGY)
60.0000 mg | Freq: Once | INTRAMUSCULAR | Status: AC
  Administered 2021-04-29: 60 mg via EPIDURAL

## 2021-04-29 NOTE — Discharge Instructions (Signed)

## 2021-05-11 ENCOUNTER — Encounter (INDEPENDENT_AMBULATORY_CARE_PROVIDER_SITE_OTHER): Payer: Self-pay

## 2021-05-11 NOTE — Addendum Note (Signed)
Addended byChristen Butter on: 05/11/2021 08:48 PM ? ? Modules accepted: Orders ? ?

## 2021-05-12 ENCOUNTER — Ambulatory Visit: Payer: Managed Care, Other (non HMO) | Admitting: Physical Therapy

## 2021-05-12 ENCOUNTER — Other Ambulatory Visit: Payer: Self-pay | Admitting: Medical-Surgical

## 2021-05-13 ENCOUNTER — Ambulatory Visit: Payer: Managed Care, Other (non HMO)

## 2021-05-14 ENCOUNTER — Encounter: Payer: Managed Care, Other (non HMO) | Admitting: Obstetrics and Gynecology

## 2021-05-18 ENCOUNTER — Ambulatory Visit: Payer: Managed Care, Other (non HMO) | Attending: Medical-Surgical | Admitting: Physical Therapy

## 2021-05-22 ENCOUNTER — Telehealth (HOSPITAL_COMMUNITY): Payer: Self-pay | Admitting: Licensed Clinical Social Worker

## 2021-05-22 NOTE — Telephone Encounter (Signed)
Need to r/s appt due to [facility] being out  ? ?06/18/2021 at [place] with [facility]  ? ?called on 4/7 at 11:28a to r/s this appt - not able to leave vm ?

## 2021-05-25 ENCOUNTER — Telehealth (HOSPITAL_COMMUNITY): Payer: Self-pay | Admitting: Licensed Clinical Social Worker

## 2021-05-25 ENCOUNTER — Encounter: Payer: Managed Care, Other (non HMO) | Admitting: Obstetrics and Gynecology

## 2021-05-25 NOTE — Telephone Encounter (Signed)
Beth Lane will be out of office on 5/4 ? ?I called on 4/7 at 11:28a to r/s this appt - not able to leave vm ? ?I called on 4/10 at 9:18a to r/s this appt - left a vm ?

## 2021-05-26 ENCOUNTER — Ambulatory Visit (INDEPENDENT_AMBULATORY_CARE_PROVIDER_SITE_OTHER): Payer: Managed Care, Other (non HMO) | Admitting: Sports Medicine

## 2021-05-26 ENCOUNTER — Ambulatory Visit: Payer: Managed Care, Other (non HMO) | Admitting: Physical Therapy

## 2021-05-26 DIAGNOSIS — M797 Fibromyalgia: Secondary | ICD-10-CM

## 2021-05-26 DIAGNOSIS — M5030 Other cervical disc degeneration, unspecified cervical region: Secondary | ICD-10-CM

## 2021-05-26 MED ORDER — PREGABALIN 200 MG PO CAPS: 200.0000 mg | ORAL_CAPSULE | Freq: Three times a day (TID) | ORAL | 3 refills | Status: DC

## 2021-05-26 NOTE — Assessment & Plan Note (Signed)
Beth Lane does have chronic pain, MRI of the lumbar spine was completely unremarkable, symptoms were relatively well controlled with oxycodone, Lyrica, Robaxin. ?Her pain doctor was 3 hours away so she has discontinued care with him, and has run out of oxycodone. ?She did not get along with the most recent pain provider at the [facility] so we will try another pain clinic. ?I do think that she will need chronic long-term narcotics, she was significantly more functional and comfortable on her oxycodone. ?In the meantime I will bump up her Lyrica to 200 mg 3 times daily. ?

## 2021-05-26 NOTE — Progress Notes (Signed)
? ? ?  Procedures performed today:   ? ?None. ? ?Independent interpretation of notes and tests performed by another provider:  ? ?None. ? ?Brief History, Exam, Impression, and Recommendations:   ? ?DDD (degenerative disc disease), cervical ?Chris's pain improved considerably with a cervical epidural, MRIs did show C5-T1 degenerative changes. ?There is also an element of fibromyalgia which has been improved to some degree with Lyrica and Robaxin. ? ?Fibromyalgia affecting multiple sites ?Thayer Ohm does have chronic pain, MRI of the lumbar spine was completely unremarkable, symptoms were relatively well controlled with oxycodone, Lyrica, Robaxin. ?Her pain doctor was 3 hours away so she has discontinued care with him, and has run out of oxycodone. ?She did not get along with the most recent pain provider at the [facility] so we will try another pain clinic. ?I do think that she will need chronic long-term narcotics, she was significantly more functional and comfortable on her oxycodone. ?In the meantime I will bump up her Lyrica to 200 mg 3 times daily. ? ?Chronic process with exacerbation and pharmacologic intervention ? ?___________________________________________ ?Ihor Austin. Benjamin Stain, M.D., ABFM., CAQSM. ?Primary Care and Sports Medicine ?[facility] ? ?Adjunct Instructor of Family Medicine  ?[employer] ?

## 2021-05-26 NOTE — Assessment & Plan Note (Signed)
Chris's pain improved considerably with a cervical epidural, MRIs did show C5-T1 degenerative changes. ?There is also an element of fibromyalgia which has been improved to some degree with Lyrica and Robaxin. ?

## 2021-05-27 ENCOUNTER — Encounter: Payer: Managed Care, Other (non HMO) | Admitting: Physical Therapy

## 2021-05-31 ENCOUNTER — Encounter: Payer: Self-pay | Admitting: Medical-Surgical

## 2021-06-01 ENCOUNTER — Encounter: Payer: Managed Care, Other (non HMO) | Admitting: Obstetrics & Gynecology

## 2021-06-01 ENCOUNTER — Ambulatory Visit (INDEPENDENT_AMBULATORY_CARE_PROVIDER_SITE_OTHER): Payer: 59 | Admitting: Licensed Clinical Social Worker

## 2021-06-01 ENCOUNTER — Ambulatory Visit: Payer: Managed Care, Other (non HMO) | Admitting: Physical Therapy

## 2021-06-01 ENCOUNTER — Encounter: Payer: Self-pay | Admitting: Sports Medicine

## 2021-06-01 ENCOUNTER — Other Ambulatory Visit: Payer: Self-pay

## 2021-06-01 ENCOUNTER — Encounter (HOSPITAL_BASED_OUTPATIENT_CLINIC_OR_DEPARTMENT_OTHER): Payer: Self-pay

## 2021-06-01 ENCOUNTER — Emergency Department (HOSPITAL_BASED_OUTPATIENT_CLINIC_OR_DEPARTMENT_OTHER)
Admission: EM | Admit: 2021-06-01 | Discharge: 2021-06-01 | Discharge status: Against Medical Advice | Payer: Managed Care, Other (non HMO) | Attending: Emergency Medicine | Admitting: Emergency Medicine

## 2021-06-01 DIAGNOSIS — R200 Anesthesia of skin: Secondary | ICD-10-CM

## 2021-06-01 DIAGNOSIS — M62838 Other muscle spasm: Secondary | ICD-10-CM

## 2021-06-01 DIAGNOSIS — F331 Major depressive disorder, recurrent, moderate: Secondary | ICD-10-CM | POA: Diagnosis not present

## 2021-06-01 DIAGNOSIS — R531 Weakness: Secondary | ICD-10-CM | POA: Diagnosis present

## 2021-06-01 DIAGNOSIS — F4310 Post-traumatic stress disorder, unspecified: Secondary | ICD-10-CM | POA: Diagnosis not present

## 2021-06-01 DIAGNOSIS — R202 Paresthesia of skin: Secondary | ICD-10-CM | POA: Diagnosis not present

## 2021-06-01 DIAGNOSIS — F411 Generalized anxiety disorder: Secondary | ICD-10-CM

## 2021-06-01 LAB — COMPREHENSIVE METABOLIC PANEL
ALT: 19 U/L (ref 0–44)
AST: 15 U/L (ref 15–41)
Albumin: 3.9 g/dL (ref 3.5–5.0)
Alkaline Phosphatase: 50 U/L (ref 38–126)
Anion gap: 7 (ref 5–15)
BUN: 16 mg/dL (ref 6–20)
CO2: 24 mmol/L (ref 22–32)
Calcium: 9 mg/dL (ref 8.9–10.3)
Chloride: 108 mmol/L (ref 98–111)
Creatinine, Ser: 0.71 mg/dL (ref 0.44–1.00)
GFR, Estimated: >60 mL/min (ref >60)
Glucose, Bld: 114 mg/dL — ABNORMAL HIGH (ref 70–99)
Potassium: 3.4 mmol/L — ABNORMAL LOW (ref 3.5–5.1)
Sodium: 139 mmol/L (ref 135–145)
Total Bilirubin: 0.6 mg/dL (ref 0.3–1.2)
Total Protein: 7.2 g/dL (ref 6.5–8.1)

## 2021-06-01 LAB — URINALYSIS, ROUTINE W REFLEX MICROSCOPIC
Bilirubin Urine: NEGATIVE
Glucose, UA: NEGATIVE mg/dL
Ketones, ur: NEGATIVE mg/dL
Nitrite: POSITIVE — AB
Protein, ur: NEGATIVE mg/dL
Specific Gravity, Urine: 1.025 (ref 1.005–1.030)
pH: 6 (ref 5.0–8.0)

## 2021-06-01 LAB — CBC WITH DIFFERENTIAL/PLATELET
Abs Immature Granulocytes: 0.02 10*3/uL (ref 0.00–0.07)
Basophils Absolute: 0 10*3/uL (ref 0.0–0.1)
Basophils Relative: 0 %
Eosinophils Absolute: 0 10*3/uL (ref 0.0–0.5)
Eosinophils Relative: 1 %
HCT: 37.3 % (ref 36.0–46.0)
Hemoglobin: 13.5 g/dL (ref 12.0–15.0)
Immature Granulocytes: 0 %
Lymphocytes Relative: 43 %
Lymphs Abs: 3 10*3/uL (ref 0.7–4.0)
MCH: 32.5 pg (ref 26.0–34.0)
MCHC: 36.2 g/dL — ABNORMAL HIGH (ref 30.0–36.0)
MCV: 89.7 fL (ref 80.0–100.0)
Monocytes Absolute: 0.4 10*3/uL (ref 0.1–1.0)
Monocytes Relative: 5 %
Neutro Abs: 3.6 10*3/uL (ref 1.7–7.7)
Neutrophils Relative %: 51 %
Platelets: 265 10*3/uL (ref 150–400)
RBC: 4.16 MIL/uL (ref 3.87–5.11)
RDW: 12.8 % (ref 11.5–15.5)
WBC: 7.1 10*3/uL (ref 4.0–10.5)
nRBC: 0 % (ref 0.0–0.2)

## 2021-06-01 LAB — URINALYSIS, MICROSCOPIC (REFLEX)

## 2021-06-01 LAB — RAPID URINE DRUG SCREEN, HOSP PERFORMED
Amphetamines: POSITIVE — AB
Barbiturates: NOT DETECTED
Benzodiazepines: NOT DETECTED
Cocaine: NOT DETECTED
Opiates: NOT DETECTED
Tetrahydrocannabinol: NOT DETECTED

## 2021-06-01 LAB — PREGNANCY, URINE: Preg Test, Ur: NEGATIVE

## 2021-06-01 LAB — MAGNESIUM: Magnesium: 2 mg/dL (ref 1.7–2.4)

## 2021-06-01 MED ORDER — SODIUM CHLORIDE 0.9 % IV SOLN: 1000.0000 mg | Freq: Once | INTRAVENOUS | Status: DC

## 2021-06-01 NOTE — ED Notes (Signed)
Pt. Reports struggling with this for the last 4 days  ?

## 2021-06-01 NOTE — ED Provider Notes (Signed)
?[facility]PART[facility] ?Provider Note ? ? ?CSN: [id number] ?Arrival date & time: 06/01/21  1904 ? ?  ? ?History ? ?Chief Complaint  ?Patient presents with  ? Spasms  ? ? ?Beth Lane is a 36 y.o. female. ? ?36 year old female presents with her partner for evaluation of generalized weakness, and muscle spasms of 3 to 4-day duration, associated with numbness.  Denies fever, chills.  She states she has had UTI for multiple weeks and has tried 3 different antibiotics without improvement in symptoms.  She is currently awaiting referral to urology.  She states her spasms got worse today and was unable to get in with her PCP so presented to the emergency room.  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  Without difficulty communicating, or facial droop. ? ?The history is provided by the patient. No language interpreter was used.  ? ?  ? ?Home Medications ?Prior to Admission medications   ?Medication Sig Start Date End Date Taking? Authorizing Provider  ?esomeprazole (NEXIUM) 40 MG capsule TAKE ONE CAPSULE BY MOUTH DAILY AT NOON 05/12/21   Samuel Bouche, NP  ?amoxicillin-clavulanate (AUGMENTIN) 875-125 MG tablet Take 1 tablet by mouth 2 (two) times daily. 04/21/21   Samuel Bouche, NP  ?diclofenac Sodium (VOLTAREN) 1 % GEL SMARTSIG:2 Gram(s) Topical 3 Times Daily PRN 03/16/21   [provider]  ?fluticasone-salmeterol (ADVAIR DISKUS) 250-50 MCG/ACT AEPB Inhale 1 puff into the lungs in the morning and at bedtime. 03/16/21   Samuel Bouche, NP  ?Fremanezumab-vfrm (AJOVY) 225 MG/1.5ML SOSY Inject 225 mg into the skin every 30 (thirty) days. 03/16/21   Samuel Bouche, NP  ?lidocaine-prilocaine (EMLA) cream SMARTSIG:2 Gram(s) Topical 3 Times Daily PRN 03/16/21   [provider]  ?lisdexamfetamine (VYVANSE) 40 MG capsule Take 1 capsule (40 mg total) by mouth every morning. 04/15/21   Samuel Bouche, NP  ?lisdexamfetamine  (VYVANSE) 40 MG capsule Take 1 capsule (40 mg total) by mouth every morning. 05/15/21   Samuel Bouche, NP  ?methocarbamol (ROBAXIN) 750 MG tablet Take 1 tablet (750 mg total) by mouth every 6 (six) hours as needed. 04/21/21   Silverio Decamp, MD  ?MOVANTIK 25 MG TABS tablet Take 25 mg by mouth every morning. 02/18/21   [provider]  ?Oxycodone HCl 10 MG TABS Take 10 mg by mouth every 6 (six) hours as needed. 01/16/21   [provider]  ?PARoxetine (PAXIL) 30 MG tablet Take 1 tablet (30 mg total) by mouth daily. 03/16/21   Samuel Bouche, NP  ?prazosin (MINIPRESS) 1 MG capsule Take 3 capsules (3 mg total) by mouth at bedtime. 03/16/21   Samuel Bouche, NP  ?pregabalin (LYRICA) 200 MG capsule Take 1 capsule (200 mg total) by mouth 3 (three) times daily. 05/26/21   Silverio Decamp, MD  ?Ubrogepant (UBRELVY) 50 MG TABS Take 1 tablet by mouth as needed (for migraine). 03/16/21   Samuel Bouche, NP  ?VYVANSE 40 MG capsule Take 1 capsule (40 mg total) by mouth every morning. 03/16/21   Samuel Bouche, NP  ?   ? ?Allergies    ?Iodine, Sumatriptan, and Tape   ? ?Review of Systems   ?Review of Systems  ?Constitutional:  Negative for chills and fever.  ?Eyes:  Negative for visual disturbance.  ?Respiratory:  Negative for shortness of breath.   ?Musculoskeletal:  Positive for gait problem.  ?Neurological:  Negative for weakness, light-headedness and  headaches.  ?All other systems reviewed and are negative. ? ?Physical Exam ?Updated Vital Signs ?BP (!) 170/83 (BP Location: Left Arm)   Pulse 83   Temp 98.1 ?F (36.7 ?C) (Oral)   Resp 20   Ht 5\' 6"  (1.676 m)   Wt 106.6 kg   LMP 05/16/2021 (Approximate)   SpO2 96%   BMI 37.93 kg/m?  ?Physical Exam ?Vitals and nursing note reviewed.  ?Constitutional:   ?   General: She is not in acute distress. ?   Appearance: Normal appearance. She is not ill-appearing.  ?HENT:  ?   Head: Normocephalic and atraumatic.  ?   Nose: Nose normal.  ?Eyes:  ?   General: No scleral  icterus. ?   Extraocular Movements: Extraocular movements intact.  ?   Conjunctiva/sclera: Conjunctivae normal.  ?Cardiovascular:  ?   Rate and Rhythm: Normal rate and regular rhythm.  ?   Pulses: Normal pulses.  ?   Heart sounds: Normal heart sounds.  ?Pulmonary:  ?   Effort: Pulmonary effort is normal. No respiratory distress.  ?   Breath sounds: Normal breath sounds. No wheezing or rales.  ?Abdominal:  ?   General: There is no distension.  ?   Tenderness: There is no abdominal tenderness.  ?Musculoskeletal:     ?   General: Normal range of motion.  ?   Cervical back: Normal range of motion.  ?Skin: ?   General: Skin is warm and dry.  ?Neurological:  ?   General: No focal deficit present.  ?   Mental Status: She is alert. Mental status is at baseline.  ?   Comments: Generalized muscle twitching and spasms noted at rest or with activity.  Generalized muscle weakness noted that is slightly worse on the right side.  Patient has 2+ patellar and brachial reflexes.  Patient is able to ambulate however with some difficulty.  Sensation intact in bilateral upper and lower extremities.  ? ? ?ED Results / Procedures / Treatments   ?Labs ?(all labs ordered are listed, but only abnormal results are displayed) ?Labs Reviewed  ?CBC WITH DIFFERENTIAL/PLATELET - Abnormal; Notable for the following components:  ?    Result Value  ? MCHC 36.2 (*)   ? All other components within normal limits  ?COMPREHENSIVE METABOLIC PANEL - Abnormal; Notable for the following components:  ? Potassium 3.4 (*)   ? Glucose, Bld 114 (*)   ? All other components within normal limits  ?URINALYSIS, ROUTINE W REFLEX MICROSCOPIC - Abnormal; Notable for the following components:  ? Hgb urine dipstick SMALL (*)   ? Nitrite POSITIVE (*)   ? Leukocytes,Ua TRACE (*)   ? All other components within normal limits  ?RAPID URINE DRUG SCREEN, HOSP PERFORMED - Abnormal; Notable for the following components:  ? Amphetamines POSITIVE (*)   ? All other components within  normal limits  ?URINALYSIS, MICROSCOPIC (REFLEX) - Abnormal; Notable for the following components:  ? Bacteria, UA MANY (*)   ? All other components within normal limits  ?MAGNESIUM  ?PREGNANCY, URINE  ? ? ?EKG ?None ? ?Radiology ?No results found. ? ?Procedures ?Procedures  ? ? ?Medications Ordered in ED ?Medications - No data to display ? ?ED Course/ Medical Decision Making/ A&P ?  ?                        ?Medical Decision Making ?Amount and/or Complexity of Data Reviewed ?Labs: ordered. ? ? ?36 year old female presents today for  evaluation of generalized weakness, numbness which is intermittent and in bilateral lower extremities up to about the knees, and in bilateral upper extremities up to the elbow.  She also endorses muscle spasms which have been going on for the past 4 days.  Work-up reveals CMP with potassium 3.4, CBC without leukocytosis or anemia.  UDS positive for amphetamines, UA with findings consistent with UTI.  Patient reports she has UTI for the past few months and has tried multiple antibiotics and is awaiting a referral to urology.  Does endorse UTI symptoms.  Case discussed with Dr. Leonel Ramsay with neurology.  Who recommends given patient 1 g load of Dilantin and reevaluating afterwards.  He is concerned patient may have peripheral nerve hyperexcitability.  If symptoms or not improved after Dilantin load he recommends transfer for MRI of cervical spine with and without MRI. ? ?Unfortunately we do not have Dilantin given that it was used earlier for a different patient.  Discussed with neurology who recommends transferring patient over for MRI.  Recommendation of neurology discussed with patient.  She states she has to wake up early in the morning to take care of her kids and decides to leave AMA.  Discussed potential for acute concerning etiology of her symptoms and a bad outcome.  She voices understanding and is in agreement with plan she plans to follow-up with her PCP and return for  worsening symptoms. ? ?Final Clinical Impression(s) / ED Diagnoses ?Final diagnoses:  ?Generalized weakness  ?Muscle spasm  ?Numbness  ? ? ?Rx / DC Orders ?ED Discharge Orders   ? ? None  ? ?  ? ? ?  ?Evlyn Courier, PA-C ?[phone]

## 2021-06-01 NOTE — ED Notes (Signed)
Pt. Called RN into room and said she wanted to go on home and she would follow up with her PCP ? ?RN let PAC know  ? ?PA C into the room and spoke with the Pt. Before discharge papers done. ?

## 2021-06-01 NOTE — Discharge Instructions (Signed)
Your case was discussed with neurologist who recommended that you be transferred to [facility] to have an MRI done to evaluate for any concerning cause of your symptoms.  After discussion you decided you do not want to have this done as you have cancer that she have to tend to.  You are aware that you are leaving AGAINST MEDICAL ADVICE and depending on the cause of your symptoms you could have a bad outcome.  He voiced understanding and you state you will follow-up with your primary care provider and will return to the emergency room for any worsening symptoms. ?

## 2021-06-01 NOTE — ED Triage Notes (Signed)
Patient presents with complaint of muscle spasms about 1 week ago and numbness to bilateral hands and feet about 1 week ago.  She also states she stopped taking oxycodone 1 month ago and is not sure if that may be contributory.   ?

## 2021-06-01 NOTE — ED Notes (Signed)
ED Provider at bedside. ? ?PA C speaking with the Pt. About Neuro consult and going to [facility] to get MRI done.  Pt. Is not wanting to wait in ED. ? ?PA C explained that the MRI will be ordered but there may be a short wait. ? ?
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
## 2021-06-02 ENCOUNTER — Encounter: Payer: Self-pay | Admitting: Medical-Surgical

## 2021-06-03 ENCOUNTER — Telehealth: Payer: Self-pay | Admitting: General Practice

## 2021-06-03 NOTE — Telephone Encounter (Signed)
Transition Care Management Follow-up Telephone Call ?Date of discharge and from where: 06/01/21 from [facility] ?How have you been since you were released from the hospital? Still having the twitching and jerking. She has an appointment Friday with PCP. Recommended patient to go back to the E.R. if her symptoms worsen.  ?Any questions or concerns? No ? ?Items Reviewed: ?Did the pt receive and understand the discharge instructions provided? Yes  ?Medications obtained and verified? No  ?Other? No  ?Any new allergies since your discharge? No  ?Dietary orders reviewed? Yes ?Do you have support at home? Yes  ? ?Home Care and Equipment/Supplies: ?Were home health services ordered? no ? ?Functional Questionnaire: (I = Independent and D = Dependent) ?ADLs: I ? ?Bathing/Dressing- I ? ?Meal Prep- I ? ?Eating- I ? ?Maintaining continence- I ? ?Transferring/Ambulation- I ? ?Managing Meds- I ? ?Follow up appointments reviewed: ? ?PCP Hospital f/u appt confirmed? Yes  Scheduled to see Samuel Bouche, NP on 05/26/21 @ 1110. ?[facility] f/u appt confirmed? No   ?Are transportation arrangements needed? No  ?If their condition worsens, is the pt aware to call PCP or go to the Emergency Dept.? Yes ?Was the patient provided with contact information for the PCP's office or ED? Yes ?Was to pt encouraged to call back with questions or concerns? Yes  ?

## 2021-06-04 NOTE — Progress Notes (Signed)
?  HPI with pertinent ROS:  ? ?CC: Hospital follow-up ? ?HPI: ?Pleasant 36 year old female presenting today for a 12-month follow-up however we are making this into a hospital follow-up since she was recently evaluated in the ED for neurological changes.  Notes that she started the increased dose of Lyrica 200 mg 3 times daily as instructed and approximately 3 to 4 days later, she began to experience numbness, tingling, and generalized weakness in all 4 extremities.  She has also been having muscle spasms causing a head tremor as well as jerking of her neck and shoulders.  She was evaluated in the ED and they recommended transferring her to the main campus to get an MRI of the cervical spine however they were unable to complete this since she had to leave to go home and take care of her children.  During her hospital course they also recommended giving her Dilantin however they had none on site and were unable to complete this step.  Today, she continues to have significant muscle spasms and continued symptoms in all 4 extremities.  She did go back down on her Lyrica to 150 mg 3 times daily starting yesterday and notes that her symptoms may have improved slightly but are still causing difficulty with her regular activities.  Notes that she is very uncomfortable and has been without her oxycodone for about 3 weeks since she was unable to get in with pain management.  During her hospitalization, she was also found to have many bacteria in her urine and this was sent for culture.  She has not been able to get in touch with urology although the referral was placed almost a month ago. ? ?I reviewed the past medical history, family history, social history, surgical history, and allergies today and no changes were needed.  Please see the problem list section below in epic for further details. ? ? ?Physical exam:  ? ?General: Well Developed, well nourished, and in no acute distress.  ?Neuro: Alert and oriented x3. Muscle  spasms affecting the neck and shoulders with associated head tremor. Weakness of hand grips, right worse than left. Disturbed gait.  ?HEENT: Normocephalic, atraumatic.  ?Skin: Warm and dry. ?Cardiac: Regular rate and rhythm, no murmurs rubs or gallops, no lower extremity edema.  ?Respiratory: Clear to auscultation bilaterally. Not using accessory muscles, speaking in full sentences. ? ?Impression and Recommendations:   ? ?1. Generalized weakness ?2. Numbness ?3. Hypertonia ?MRI cervical spine ordered per ED provider recommendations. Urgent referral placed for Neurology. Continue reduced dose of Lyrica 150mg  TID and monitor symptoms. Strict ED return precautions.  ?- MR CERVICAL SPINE W WO CONTRAST; Future ?- Ambulatory referral to Neurology ? ?Information regarding referral to Urology and the practice contact information provided to patient for her to reach out regarding scheduling. Referral coordinator consulted regarding pain management referral and urgent referral for neurology.  ? ?Return if symptoms worsen or fail to improve. ?___________________________________________ ? , DNP, APRN, FNP-BC ?Primary Care and Sports Medicine ?[facility] ?

## 2021-06-05 ENCOUNTER — Encounter: Payer: Self-pay | Admitting: Medical-Surgical

## 2021-06-05 ENCOUNTER — Ambulatory Visit (INDEPENDENT_AMBULATORY_CARE_PROVIDER_SITE_OTHER): Payer: Managed Care, Other (non HMO) | Admitting: Medical-Surgical

## 2021-06-05 ENCOUNTER — Other Ambulatory Visit (HOSPITAL_BASED_OUTPATIENT_CLINIC_OR_DEPARTMENT_OTHER): Payer: Self-pay | Admitting: Medical-Surgical

## 2021-06-05 VITALS — BP 126/84 | HR 103 | Resp 20 | Ht 66.0 in | Wt 242.0 lb

## 2021-06-05 DIAGNOSIS — R531 Weakness: Secondary | ICD-10-CM

## 2021-06-05 DIAGNOSIS — M6289 Other specified disorders of muscle: Secondary | ICD-10-CM | POA: Diagnosis not present

## 2021-06-05 DIAGNOSIS — R200 Anesthesia of skin: Secondary | ICD-10-CM

## 2021-06-05 NOTE — Patient Instructions (Addendum)
[facility] at [phone]  ? ?Reduce Lyrica to 150mg  3 times daily.  ? ?Urgent referral to Neurology placed. Send to [facility].  ? ?MRI ordered. Working on authorization.  ?

## 2021-06-06 ENCOUNTER — Ambulatory Visit (HOSPITAL_BASED_OUTPATIENT_CLINIC_OR_DEPARTMENT_OTHER)
Admission: RE | Admit: 2021-06-06 | Discharge: 2021-06-06 | Disposition: A | Discharge status: Home / Self Care | Payer: Managed Care, Other (non HMO) | Source: Ambulatory Visit | Attending: Medical-Surgical | Admitting: Medical-Surgical

## 2021-06-06 DIAGNOSIS — M6289 Other specified disorders of muscle: Secondary | ICD-10-CM | POA: Diagnosis present

## 2021-06-06 DIAGNOSIS — R200 Anesthesia of skin: Secondary | ICD-10-CM | POA: Diagnosis present

## 2021-06-06 DIAGNOSIS — R531 Weakness: Secondary | ICD-10-CM | POA: Insufficient documentation

## 2021-06-06 MED ORDER — GADOBUTROL 1 MMOL/ML IV SOLN
10.0000 mL | Freq: Once | INTRAVENOUS | Status: AC | PRN
  Administered 2021-06-06: 10 mL via INTRAVENOUS

## 2021-06-07 ENCOUNTER — Encounter: Payer: Self-pay | Admitting: Sports Medicine

## 2021-06-07 ENCOUNTER — Encounter: Payer: Self-pay | Admitting: Medical-Surgical

## 2021-06-08 ENCOUNTER — Encounter (HOSPITAL_COMMUNITY): Payer: Self-pay | Admitting: Psychiatry

## 2021-06-08 ENCOUNTER — Telehealth (INDEPENDENT_AMBULATORY_CARE_PROVIDER_SITE_OTHER): Payer: 59 | Admitting: Psychiatry

## 2021-06-08 DIAGNOSIS — F331 Major depressive disorder, recurrent, moderate: Secondary | ICD-10-CM

## 2021-06-08 DIAGNOSIS — F411 Generalized anxiety disorder: Secondary | ICD-10-CM

## 2021-06-08 DIAGNOSIS — F4310 Post-traumatic stress disorder, unspecified: Secondary | ICD-10-CM

## 2021-06-08 NOTE — Telephone Encounter (Signed)
See other MyChart message
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
## 2021-06-10 ENCOUNTER — Encounter: Payer: Managed Care, Other (non HMO) | Admitting: Physical Therapy

## 2021-06-12 ENCOUNTER — Encounter: Payer: Self-pay | Admitting: Medical-Surgical

## 2021-06-15 ENCOUNTER — Ambulatory Visit: Payer: Managed Care, Other (non HMO) | Admitting: Medical-Surgical

## 2021-06-15 ENCOUNTER — Other Ambulatory Visit: Payer: Self-pay | Admitting: Medical-Surgical

## 2021-06-15 NOTE — Telephone Encounter (Signed)
Are there any other pain clinics in the area that we can send her to? The one where she did the appointment was not a good experience and she would like to see someone else.  ?

## 2021-06-17 ENCOUNTER — Ambulatory Visit (INDEPENDENT_AMBULATORY_CARE_PROVIDER_SITE_OTHER): Payer: Managed Care, Other (non HMO)

## 2021-06-17 ENCOUNTER — Ambulatory Visit (INDEPENDENT_AMBULATORY_CARE_PROVIDER_SITE_OTHER): Payer: Managed Care, Other (non HMO) | Admitting: Sports Medicine

## 2021-06-17 ENCOUNTER — Encounter: Payer: Managed Care, Other (non HMO) | Admitting: Physical Therapy

## 2021-06-17 DIAGNOSIS — M25552 Pain in left hip: Secondary | ICD-10-CM

## 2021-06-17 DIAGNOSIS — M797 Fibromyalgia: Secondary | ICD-10-CM

## 2021-06-17 DIAGNOSIS — G8929 Other chronic pain: Secondary | ICD-10-CM | POA: Diagnosis not present

## 2021-06-17 DIAGNOSIS — R1030 Lower abdominal pain, unspecified: Secondary | ICD-10-CM | POA: Diagnosis not present

## 2021-06-17 DIAGNOSIS — M5030 Other cervical disc degeneration, unspecified cervical region: Secondary | ICD-10-CM

## 2021-06-17 NOTE — Assessment & Plan Note (Signed)
Persistent left hip pain, she does endorse the C sign when describing the hip pain, often worse with flexion, internal rotation. ?Suspect labral tear, adding x-rays and MR arthrography. ?

## 2021-06-17 NOTE — Assessment & Plan Note (Signed)
This is a pleasant 36 year old female, very mild C5-T1 degenerative changes are dominant at C5-C7, she does have some disc desiccation and small protrusions without central or foraminal stenosis. ?She did relatively well with the cervical epidural so we will repeat the injection. ?

## 2021-06-17 NOTE — Assessment & Plan Note (Signed)
Back down to Lyrica 150, she was getting some movement type symptoms on 200. ?Currently looking for another pain management provider, I do think she will need oxycodone long-term. ?

## 2021-06-17 NOTE — Progress Notes (Signed)
? ? ?  Procedures performed today:   ? ?None. ? ?Independent interpretation of notes and tests performed by another provider:  ? ?Cervical spine MRI personally reviewed, there is mild C5-C7 DDD with desiccation and protrusions but no central or foraminal stenosis. ? ?Brief History, Exam, Impression, and Recommendations:   ? ?DDD (degenerative disc disease), cervical ?This is a pleasant 36 year old female, very mild C5-T1 degenerative changes are dominant at C5-C7, she does have some disc desiccation and small protrusions without central or foraminal stenosis. ?She did relatively well with the cervical epidural so we will repeat the injection. ? ?Fibromyalgia affecting multiple sites ?Back down to Lyrica 150, she was getting some movement type symptoms on 200. ?Currently looking for another pain management provider, I do think she will need oxycodone long-term. ? ?Chronic left hip pain ?Persistent left hip pain, she does endorse the C sign when describing the hip pain, often worse with flexion, internal rotation. ?Suspect labral tear, adding x-rays and MR arthrography. ? ? ? ?___________________________________________ ?Ihor Austin. Benjamin Stain, M.D., ABFM., CAQSM. ?Primary Care and Sports Medicine ?[facility] ? ?Adjunct Instructor of Family Medicine  ?[employer] ?

## 2021-06-18 ENCOUNTER — Ambulatory Visit (HOSPITAL_COMMUNITY): Payer: Self-pay | Admitting: Licensed Clinical Social Worker

## 2021-06-23 ENCOUNTER — Ambulatory Visit (INDEPENDENT_AMBULATORY_CARE_PROVIDER_SITE_OTHER): Payer: Managed Care, Other (non HMO)

## 2021-06-23 ENCOUNTER — Ambulatory Visit (INDEPENDENT_AMBULATORY_CARE_PROVIDER_SITE_OTHER): Payer: Medicaid Other

## 2021-06-23 ENCOUNTER — Ambulatory Visit: Payer: Managed Care, Other (non HMO) | Admitting: Sports Medicine

## 2021-06-23 ENCOUNTER — Ambulatory Visit (INDEPENDENT_AMBULATORY_CARE_PROVIDER_SITE_OTHER): Payer: Managed Care, Other (non HMO) | Admitting: Sports Medicine

## 2021-06-23 DIAGNOSIS — M25552 Pain in left hip: Secondary | ICD-10-CM

## 2021-06-23 DIAGNOSIS — G8929 Other chronic pain: Secondary | ICD-10-CM

## 2021-06-23 MED ORDER — GADOBUTROL 1 MMOL/ML IV SOLN
1.0000 mL | Freq: Once | INTRAVENOUS | Status: AC | PRN
  Administered 2021-06-23: 1 mL via INTRAVENOUS

## 2021-06-23 NOTE — Progress Notes (Signed)
? ? ?  Procedures performed today:   ? ?Procedure: Real-time Ultrasound Guided gadolinium contrast injection of left hip joint ?Device: Samsung HS60  ?Verbal informed consent obtained.  ?Time-out conducted.  ?Noted no overlying erythema, induration, or other signs of local infection.  ?Skin prepped in a sterile fashion.  ?Local anesthesia: Topical Ethyl chloride.  ?With sterile technique and under real time ultrasound guidance: 22-gauge needle advanced to the femoral head/neck junction, contacted bone, normal-appearing joint, I then injected 1 cc kenalog 40, 2 cc lidocaine, 2 cc bupivacaine, syringe switched and 0.1 cc gallium injected, syringe again switched and 10 cc sterile saline used to fully distend the joint. ?Joint visualized and capsule seen distending confirming intra-articular placement of contrast material and medication. ?Completed without difficulty  ?Advised to call if fevers/chills, erythema, induration, drainage, or persistent bleeding.  ?Images permanently stored in PACS ?Impression: Technically successful ultrasound guided gadolinium contrast injection for MR arthrography.  Please see separate MR arthrogram report. ? ?Independent interpretation of notes and tests performed by another provider:  ? ?None. ? ?Brief History, Exam, Impression, and Recommendations:   ? ?Chronic left hip pain ?Please see prior notes, today we performed an injection for MR arthrography. ? ? ? ?___________________________________________ ?Ihor Austin. Benjamin Stain, M.D., ABFM., CAQSM. ?Primary Care and Sports Medicine ?[facility] ? ?Adjunct Instructor of Family Medicine  ?[employer] ?

## 2021-06-23 NOTE — Assessment & Plan Note (Signed)
Please see prior notes, today we performed an injection for MR arthrography. ?

## 2021-06-24 ENCOUNTER — Encounter: Payer: Self-pay | Admitting: Sports Medicine

## 2021-06-24 ENCOUNTER — Other Ambulatory Visit: Payer: Self-pay | Admitting: Medical-Surgical

## 2021-06-25 ENCOUNTER — Encounter: Payer: Self-pay | Admitting: Medical-Surgical

## 2021-06-25 ENCOUNTER — Telehealth: Payer: Self-pay

## 2021-06-25 NOTE — Telephone Encounter (Addendum)
Initiated Prior authorization VZD:GLOVF (fremanezumab-vfrm) injection 225MG /1.5ML auto-injectors Via:[employer] (called ) Case:[id number] Status: pending as of 06/25/21 Reason: 08/25/21 is good until 09/23/21 Notified Pt via: Mychart

## 2021-06-30 ENCOUNTER — Ambulatory Visit: Payer: Managed Care, Other (non HMO) | Admitting: Sports Medicine

## 2021-06-30 ENCOUNTER — Other Ambulatory Visit: Payer: Self-pay | Admitting: Medical-Surgical

## 2021-06-30 ENCOUNTER — Other Ambulatory Visit: Payer: Managed Care, Other (non HMO)

## 2021-07-01 MED ORDER — VYVANSE 40 MG PO CAPS: 40.0000 mg | ORAL_CAPSULE | Freq: Every morning | ORAL | 0 refills | Status: DC

## 2021-07-03 ENCOUNTER — Encounter: Payer: Self-pay | Admitting: Medical-Surgical

## 2021-07-03 DIAGNOSIS — K5900 Constipation, unspecified: Secondary | ICD-10-CM

## 2021-07-03 NOTE — Telephone Encounter (Signed)
GI referral pended. Sign if appropriate.

## 2021-07-06 ENCOUNTER — Other Ambulatory Visit: Payer: Self-pay

## 2021-07-06 ENCOUNTER — Encounter (HOSPITAL_COMMUNITY): Payer: Self-pay | Admitting: Emergency Medicine

## 2021-07-06 ENCOUNTER — Emergency Department (HOSPITAL_COMMUNITY): Payer: Managed Care, Other (non HMO)

## 2021-07-06 ENCOUNTER — Emergency Department (HOSPITAL_COMMUNITY)
Admission: EM | Admit: 2021-07-06 | Discharge: 2021-07-06 | Disposition: A | Discharge status: Home / Self Care | Payer: Managed Care, Other (non HMO) | Attending: Emergency Medicine | Admitting: Emergency Medicine

## 2021-07-06 ENCOUNTER — Encounter (HOSPITAL_COMMUNITY): Payer: Self-pay

## 2021-07-06 ENCOUNTER — Ambulatory Visit
Admission: RE | Admit: 2021-07-06 | Discharge: 2021-07-06 | Disposition: A | Discharge status: Home / Self Care | Payer: Medicaid Other | Source: Ambulatory Visit | Attending: Sports Medicine | Admitting: Sports Medicine

## 2021-07-06 ENCOUNTER — Ambulatory Visit (HOSPITAL_COMMUNITY): Payer: Medicaid Other | Admitting: Licensed Clinical Social Worker

## 2021-07-06 ENCOUNTER — Encounter: Payer: Self-pay | Admitting: Medical-Surgical

## 2021-07-06 DIAGNOSIS — R4781 Slurred speech: Secondary | ICD-10-CM | POA: Diagnosis not present

## 2021-07-06 DIAGNOSIS — R55 Syncope and collapse: Secondary | ICD-10-CM | POA: Insufficient documentation

## 2021-07-06 DIAGNOSIS — R000 Tachycardia, unspecified: Secondary | ICD-10-CM | POA: Insufficient documentation

## 2021-07-06 DIAGNOSIS — R29818 Other symptoms and signs involving the nervous system: Secondary | ICD-10-CM | POA: Insufficient documentation

## 2021-07-06 DIAGNOSIS — R29810 Facial weakness: Secondary | ICD-10-CM | POA: Diagnosis not present

## 2021-07-06 LAB — CBC
HCT: 38.7 % (ref 36.0–46.0)
Hemoglobin: 13.4 g/dL (ref 12.0–15.0)
MCH: 32.3 pg (ref 26.0–34.0)
MCHC: 34.6 g/dL (ref 30.0–36.0)
MCV: 93.3 fL (ref 80.0–100.0)
Platelets: 264 10*3/uL (ref 150–400)
RBC: 4.15 MIL/uL (ref 3.87–5.11)
RDW: 12.2 % (ref 11.5–15.5)
WBC: 4.4 10*3/uL (ref 4.0–10.5)
nRBC: 0 % (ref 0.0–0.2)

## 2021-07-06 LAB — BASIC METABOLIC PANEL
Anion gap: 6 (ref 5–15)
BUN: 13 mg/dL (ref 6–20)
CO2: 24 mmol/L (ref 22–32)
Calcium: 9.1 mg/dL (ref 8.9–10.3)
Chloride: 108 mmol/L (ref 98–111)
Creatinine, Ser: 0.63 mg/dL (ref 0.44–1.00)
GFR, Estimated: >60 mL/min (ref >60)
Glucose, Bld: 99 mg/dL (ref 70–99)
Potassium: 3.7 mmol/L (ref 3.5–5.1)
Sodium: 138 mmol/L (ref 135–145)

## 2021-07-06 LAB — CBG MONITORING, ED: Glucose-Capillary: 94 mg/dL (ref 70–99)

## 2021-07-06 LAB — I-STAT BETA HCG BLOOD, ED (MC, WL, AP ONLY): I-stat hCG, quantitative: <5 m[IU]/mL (ref <5)

## 2021-07-06 LAB — D-DIMER, QUANTITATIVE: D-Dimer, Quant: <0.27 ug/mL-FEU (ref 0.00–0.50)

## 2021-07-06 MED ORDER — KETOROLAC TROMETHAMINE 15 MG/ML IJ SOLN
15.0000 mg | Freq: Once | INTRAMUSCULAR | Status: AC
  Administered 2021-07-06: 15 mg via INTRAVENOUS
  Filled 2021-07-06: qty 1

## 2021-07-06 MED ORDER — METOCLOPRAMIDE HCL 5 MG/ML IJ SOLN
10.0000 mg | Freq: Once | INTRAMUSCULAR | Status: AC
  Administered 2021-07-06: 10 mg via INTRAVENOUS
  Filled 2021-07-06: qty 2

## 2021-07-06 MED ORDER — DIPHENHYDRAMINE HCL 50 MG/ML IJ SOLN
12.5000 mg | Freq: Once | INTRAMUSCULAR | Status: AC
  Administered 2021-07-06: 12.5 mg via INTRAVENOUS
  Filled 2021-07-06: qty 1

## 2021-07-06 MED ORDER — SODIUM CHLORIDE 0.9 % IV BOLUS
1000.0000 mL | Freq: Once | INTRAVENOUS | Status: AC
  Administered 2021-07-06: 1000 mL via INTRAVENOUS
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
## 2021-07-06 NOTE — ED Triage Notes (Signed)
Patient coming from home complaint of syncopal episode yesterday afternoon, complaint of feeling weak and shaky today. Minimal oral intake since yesterday morning.

## 2021-07-06 NOTE — ED Provider Triage Note (Signed)
Emergency Medicine Provider Triage Evaluation Note   Beth Lane , a 36 y.o. female  was evaluated in triage.  Pt complains of syncopal episode yesterday. Pt recently started on keflex by urology 2 days ago. Reports yesterday feeling "shaky" and had syncopal episode last night. Reports not eating or drinking well yesterday. Pt reports she has a bad headache, feeling weak and lightheaded. Contacted PCP and they advised she come to the ER.    Review of Systems  Positive:          As above Negative:         Chest pain, SOB, focal weakness   Physical Exam  BP (!) 141/94 (BP Location: Left Arm)   Pulse (!) 105   Temp 98.3 F (36.8 C) (Oral)   Resp 18   SpO2 96%  Gen:                Awake, no distress   Resp:               Normal effort  MSK:               Moves extremities without difficulty  Other:                 Medical Decision Making  Medically screening exam initiated at 11:02 AM.  Appropriate orders placed.  Beth Lane was informed that the remainder of the evaluation will be completed by another provider, this initial triage assessment does not replace that evaluation, and the importance of remaining in the ED until their evaluation is complete.   After my interview and exam of the patient I ordered appropriate labs/imaging for syncopal episode. This preliminary work up includes lab work and EKG. Patient made aware the rest of their evaluation will be performed by another provider once roomed.      Una Yeomans T, PA-C 07/06/21 1105   Nahla Lukin T, PA-C 07/06/21 1252

## 2021-07-06 NOTE — Discharge Instructions (Signed)

## 2021-07-06 NOTE — ED Provider Notes (Signed)
[place] EMERGENCY DEPARTMENT Provider Note   CSN: [id number] Arrival date & time: 07/06/21  1043     History  Chief Complaint  Patient presents with   Loss of Consciousness    Beth Lane is a 36 y.o. female who presents the emergency department after a syncopal episode last night.  Patient states that she was not feeling well past several days, and was recently placed on Keflex by her urologist for persistent UTI.  Last night at 9 PM her wife noted that she was beginning to slur her speech.  They went to bed, and she thought this was related to her being really tired.  Patient states that she woke up around 1145 to use the bathroom, and wife noted a loud sound.  When she found the patient she had fallen, and hit her head on the bed frame.  Patient states that she felt very weak, and could not support her body weight.  She believes she might of passed out.  Wife tried to get her off the floor, and then the patient fell again.  Once she got the patient up, wife states that patient was having muscle spasms in both of her arms.  Reports yesterday very little p.o. intake.  Patient reports history of conversion reaction, with strokelike symptoms.  Wife also reports history of chronic pain syndrome, and patient was recently restarted on opioids.  Wife tried administering Narcan, to see if that was the cause of patient's symptoms, with no improvement.   Loss of Consciousness Associated symptoms: headaches, shortness of breath and weakness   Associated symptoms: no chest pain, no dizziness, no fever, no nausea, no seizures and no vomiting       Home Medications Prior to Admission medications   Medication Sig Start Date End Date Taking? Authorizing Provider  ADVAIR DISKUS 250-50 MCG/ACT AEPB INHALE 1 PUFF INTO THE LUNGS IN THE MORNING AND AT BEDTIME 06/17/21   Samuel Bouche, NP  AJOVY 225 MG/1.5ML SOSY INJECT 225 MG UNDER THE SKIN EVERY 30 DAYS 06/24/21   Samuel Bouche, NP  diclofenac Sodium (VOLTAREN) 1 % GEL SMARTSIG:2 Gram(s) Topical 3 Times Daily PRN 03/16/21   [provider]  esomeprazole (NEXIUM) 40 MG capsule TAKE ONE CAPSULE BY MOUTH DAILY AT NOON 05/12/21   Samuel Bouche, NP  Lido-Capsaicin-Men-Methyl Sal (1ST MEDX-PATCH/ LIDOCAINE) 4-0.025-5-20 % PTCH Apply topically.    [provider]  lidocaine-prilocaine (EMLA) cream SMARTSIG:2 Gram(s) Topical 3 Times Daily PRN 03/16/21   [provider]  lisdexamfetamine (VYVANSE) 40 MG capsule Take 1 capsule (40 mg total) by mouth every morning. 04/15/21   Samuel Bouche, NP  lisdexamfetamine (VYVANSE) 40 MG capsule Take 1 capsule (40 mg total) by mouth every morning. 05/15/21   Samuel Bouche, NP  methocarbamol (ROBAXIN) 750 MG tablet Take 1 tablet (750 mg total) by mouth every 6 (six) hours as needed. 04/21/21   Silverio Decamp, MD  MOVANTIK 25 MG TABS tablet Take 25 mg by mouth every morning. 02/18/21   [provider]  Oxycodone HCl 10 MG TABS Take 10 mg by mouth every 6 (six) hours as needed. 01/16/21   [provider]  PARoxetine (PAXIL) 30 MG tablet Take 1 tablet (30 mg total) by mouth daily. 03/16/21   Samuel Bouche, NP  prazosin (MINIPRESS) 1 MG capsule Take 3 capsules (3 mg total) by mouth at bedtime. 03/16/21   Samuel Bouche, NP  pregabalin (LYRICA) 150 MG capsule Take 1 capsule (150 mg  total) by mouth 3 (three) times daily. 06/17/21   Silverio Decamp, MD  Ubrogepant (UBRELVY) 50 MG TABS Take 1 tablet by mouth as needed (for migraine). 03/16/21   Samuel Bouche, NP  VYVANSE 40 MG capsule Take 1 capsule (40 mg total) by mouth every morning. 07/01/21   Samuel Bouche, NP      Allergies    Sumatriptan, Barium sulfate, and Tape    Review of Systems   Review of Systems  Constitutional:  Negative for fever.  Eyes:  Positive for visual disturbance.  Respiratory:  Positive for shortness of breath.   Cardiovascular:  Positive for syncope. Negative for chest pain.   Gastrointestinal:  Negative for abdominal pain, nausea and vomiting.  Musculoskeletal:        Muscle spasms  Neurological:  Positive for syncope, facial asymmetry, speech difficulty, weakness and headaches. Negative for dizziness, seizures, light-headedness and numbness.  All other systems reviewed and are negative.  Physical Exam Updated Vital Signs BP 115/72 (BP Location: Left Arm)   Pulse 75   Temp 98.3 F (36.8 C) (Oral)   Resp 16   SpO2 98%  Physical Exam Vitals and nursing note reviewed.  Constitutional:      Appearance: Normal appearance.  HENT:     Head: Normocephalic and atraumatic.  Eyes:     Conjunctiva/sclera: Conjunctivae normal.  Cardiovascular:     Rate and Rhythm: Normal rate and regular rhythm.  Pulmonary:     Effort: Pulmonary effort is normal. No respiratory distress.     Breath sounds: Normal breath sounds.  Abdominal:     General: There is no distension.     Palpations: Abdomen is soft.     Tenderness: There is no abdominal tenderness.  Skin:    General: Skin is warm and dry.  Neurological:     General: No focal deficit present.     Mental Status: She is alert.     Comments: Left sided facial droop. R sided grip strength and R dorsi/plantar flexion 4/5 compared to full 5/5 strength on left side. Speech is slightly slurred, but understandable. Not aphasic.  Normal sensation bilaterally.    ED Results / Procedures / Treatments   Labs (all labs ordered are listed, but only abnormal results are displayed) Labs Reviewed  BASIC METABOLIC PANEL  CBC  D-DIMER, QUANTITATIVE  URINALYSIS, ROUTINE W REFLEX MICROSCOPIC  CBG MONITORING, ED  I-STAT BETA HCG BLOOD, ED ([facility], [facility], [facility] ONLY)    EKG EKG Interpretation  Date/Time:  Monday Jul 06 2021 11:20:27 EDT Ventricular Rate:  111 PR Interval:  136 QRS Duration: 78 QT Interval:  358 QTC Calculation: 486 R Axis:   39 Text Interpretation: Sinus tachycardia Otherwise normal ECG When compared with ECG of  01-Jun-2021 19:37, PREVIOUS ECG IS PRESENT Confirmed by Lavenia Atlas [id number]) on 07/06/2021 1:00:27 PM  Radiology CT Head Wo Contrast  Result Date: 07/06/2021 CLINICAL DATA:  Loss of consciousness after head trauma. EXAM: CT HEAD WITHOUT CONTRAST TECHNIQUE: Contiguous axial images were obtained from the base of the skull through the vertex without intravenous contrast. RADIATION DOSE REDUCTION: This exam was performed according to the departmental dose-optimization program which includes automated exposure control, adjustment of the mA and/or kV according to patient size and/or use of iterative reconstruction technique. COMPARISON:  None Available. FINDINGS: Brain: No evidence of acute infarction, hemorrhage, hydrocephalus, extra-axial collection or mass lesion/mass effect. Vascular: No hyperdense vessel or unexpected calcification. Skull: Normal. Negative for fracture or focal lesion. Sinuses/Orbits: No acute  finding. Other: None. IMPRESSION: No acute intracranial abnormality seen. Electronically Signed   By: Marijo Conception M.D.   On: 07/06/2021 13:13    Procedures Procedures    Medications Ordered in ED Medications  ketorolac (TORADOL) 15 MG/ML injection 15 mg (15 mg Intravenous Given 07/06/21 1603)  metoCLOPramide (REGLAN) injection 10 mg (10 mg Intravenous Given 07/06/21 1603)  diphenhydrAMINE (BENADRYL) injection 12.5 mg (12.5 mg Intravenous Given 07/06/21 1603)  sodium chloride 0.9 % bolus 1,000 mL (1,000 mLs Intravenous New Bag/Given 07/06/21 1602)    ED Course/ Medical Decision Making/ A&P Clinical Course as of 07/06/21 1907  Mon Jul 06, 2021  1901 Pending MRI [MK]    Clinical Course User Index [MK] Teressa Lower, MD                           Medical Decision Making Amount and/or Complexity of Data Reviewed Labs: ordered. Radiology: ordered.  Risk Prescription drug management.  This patient is a 36 y.o. female who presents to the ED for concern of syncope, this involves an  extensive number of treatment options, and is a complaint that carries with it a high risk of complications and morbidity. The emergent differential diagnosis prior to evaluation includes, but is not limited to,  CVA, ACS, arrhythmia, vasovagal syncope, orthostatic hypotension, sepsis, hypoglycemia, electrolyte disturbance, respiratory failure, symptomatic anemia, dehydration, heat injury, polypharmacy, malignancy, anxiety/panic attack. This is not an exhaustive differential.   Past Medical History / Co-morbidities / Social History: Chronic pain, fibromyalgia, migraines, anxiety, depression, conversion disorder  Additional history: Chart reviewed. Pertinent results include: Was seen on 4/17 for somewhat similar symptoms. Had muscle spasms and generalized weakness for 3-4 days. Stated that she has had weakness worse on the right since her episode of conversion disorder in 2017.   Physical Exam: Physical exam performed. The pertinent findings include: Left-sided facial droop with mild slurred speech.  Right grip strength and dorsi/plantarflexion decreased compared to the left side.  EOMI, PERRLA.  Patient initially tachycardic to 105, on repeat evaluation heart rate 75.  Normal oxygen saturation.  Lab Tests: I ordered, and personally interpreted labs.  The pertinent results include: No leukocytosis, normal hemoglobin.  Normal electrolytes.  Normal glucose.  Negative pregnancy.  Negative D-dimer.   Imaging Studies: I ordered imaging studies including CT head, and MRI brain. I independently visualized and interpreted imaging which showed no acute intracranial abnormality on CT. I agree with the radiologist interpretation. MRI results pending at time of shift change.    Cardiac Monitoring:  The patient was maintained on a cardiac monitor.  My attending physician Dr. Dina Rich viewed and interpreted the cardiac monitored which showed an underlying rhythm of: Sinus tachycardia.  I agree with this  interpretation.   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 I have reviewed the patients home medicines and have made adjustments as needed.  Consultations Obtained: I requested consultation with the neurology NP Janine Ores,  and discussed lab and imaging findings as well as pertinent plan.  Neurology team able to evaluate the patient in ER, felt she did not meet LVO criteria.  Advised giving migraine cocktail, and MRI brain.  If MRI is normal, there is not a high suspicion for acute CVA.  At that point, symptoms may more so be related to conversion syndrome, similar to patient's previous presentation.  She will follow-up with her neurologist outpatient if this is the case.   Disposition: After  consideration of the diagnostic results and the patients response to treatment, I feel that patient will likely not require admission or inpatient treatment for her symptoms. Patient discussed and care transferred to attending physician Dr. Matilde Sprang at shift change. Please see his/her note for further details regarding further ED course and disposition. Plan at time of handoff is await MRI results.  If normal, patient is to follow up outpatient with neurologist. If abnormal, admit for stroke work up.   I discussed this case with my attending physician Dr. Dina Rich who cosigned this note including patient's presenting symptoms, physical exam, and planned diagnostics and interventions. Attending physician stated agreement with plan or made changes to plan which were implemented.     Final Clinical Impression(s) / ED Diagnoses Final diagnoses:  Syncope, unspecified syncope type  Focal neurological deficit    Rx / DC Orders ED Discharge Orders     None      Portions of this report may have been transcribed using voice recognition software. Every effort was made to ensure accuracy; however, inadvertent computerized transcription errors may be  present.    Kateri Plummer, PA-C 07/06/21 1907    Lorelle Gibbs, DO 07/07/21 1639

## 2021-07-06 NOTE — ED Provider Triage Note (Deleted)
Emergency Medicine Provider Triage Evaluation Note  Beth Lane , a 36 y.o. female  was evaluated in triage.  Pt complains of syncopal episode yesterday. Pt recently started on keflex by urology 2 days ago. Reports yesterday feeling "shaky" and had syncopal episode last night. Reports not eating or drinking well yesterday. Pt reports she has a bad headache, feeling weak and lightheaded. Contacted PCP and they advised she come to the ER.   Review of Systems  Positive: As above Negative: Chest pain, SOB, focal weakness  Physical Exam  BP (!) 141/94 (BP Location: Left Arm)   Pulse (!) 105   Temp 98.3 F (36.8 C) (Oral)   Resp 18   SpO2 96%  Gen:   Awake, no distress   Resp:  Normal effort  MSK:   Moves extremities without difficulty  Other:    Medical Decision Making  Medically screening exam initiated at 11:02 AM.  Appropriate orders placed.  Beth Lane was informed that the remainder of the evaluation will be completed by another provider, this initial triage assessment does not replace that evaluation, and the importance of remaining in the ED until their evaluation is complete.  After my interview and exam of the patient I ordered appropriate labs/imaging for syncopal episode. This preliminary work up includes lab work and EKG. Patient made aware the rest of their evaluation will be performed by another provider once roomed.     Katlin Bortner T, PA-C 07/06/21 1105

## 2021-07-07 ENCOUNTER — Ambulatory Visit: Payer: Managed Care, Other (non HMO) | Admitting: Neurology

## 2021-07-07 ENCOUNTER — Telehealth: Payer: Self-pay | Admitting: General Practice

## 2021-07-07 NOTE — Telephone Encounter (Signed)
Transition Care Management Follow-up Telephone Call Date of discharge and from where: 07/06/21 from [facility] How have you been since you were released from the hospital? Patient still has a headache and went to the neurologist this morning but was not seen because she was a few mins late while waiting in the line. She was told to discuss her MRI results with PCP and follow up with neurologist. She is not able to drive until she is released and wold like a virtual appointment to talk to [place].  Any questions or concerns? No  Items Reviewed: Did the pt receive and understand the discharge instructions provided? Yes  Medications obtained and verified? No  Other? No  Any new allergies since your discharge? No  Dietary orders reviewed? Yes Do you have support at home? Yes   Home Care and Equipment/Supplies: Were home health services ordered? no  Functional Questionnaire: (I = Independent and D = Dependent) ADLs: I  Bathing/Dressing- I  Meal Prep- I  Eating- I  Maintaining continence- I  Transferring/Ambulation- I  Managing Meds- I  Follow up appointments reviewed:  PCP Hospital f/u appt confirmed? Yes  Scheduled to see Samuel Bouche, NP (my chart appointment) on 07/09/21 @ 810. [facility] f/u appt confirmed? Yes  Scheduled to see the neurologist on 07/07/21 @ 815; however she was not seen because she was a few mins late.  Are transportation arrangements needed? No  If their condition worsens, is the pt aware to call PCP or go to the Emergency Dept.? Yes Was the patient provided with contact information for the PCP's office or ED? Yes Was to pt encouraged to call back with questions or concerns? Yes

## 2021-07-08 NOTE — Progress Notes (Unsigned)
Virtual Visit via Video Note  I connected with Beth Lane on 07/09/21 at  8:10 AM EDT by a video enabled telemedicine application and verified that I am speaking with the correct person using two identifiers.   I discussed the limitations of evaluation and management by telemedicine and the availability of in person appointments. The patient expressed understanding and agreed to proceed.  Patient location: home Provider locations: office  Subjective:    CC: hospital discharge follow up  HPI: Pleasant 36 year old female presenting today via [place] video visit for a follow up after being evaluated at the ED on 07/06/2021 for a suspected syncopal episode along with neurological deficits. A full evaluation was done with no suspicious findings to explain symptoms and she was discharged home with instructions to follow up with neurology as scheduled the next day.  Unfortunately, she was a few minutes late for her appointment and got stuck in line so the doctor would not see her and she had to reschedule for 07/14/2021.  Today, she reports that she still has a headache but it is more of a heavy pressure on the frontal and occipital areas of her head.  She does have vision loss to the left and notes that it goes in and out.  Sometimes her left eye vision is completely blurry other times she has double vision.  She is even noticed some lateral vision loss where she can see colors but not details.  Continues to have right-sided weakness as well as extremity "twitching".  Does note that she is overdue for her next dose of Ajovy by 2 weeks with plans to get that this afternoon and will take her dose today.  Has Roselyn Meier but this has not been helpful for her symptoms.  Currently under the care of urology for a persistent UTI.  They placed her on Keflex for 12 weeks to treat this.  History of tachycardia and patient would like to have her diagnoses updated with sinus tachycardia as she is often  asked what kind of tachycardia she has and this was determined during her hospitalization.  Past medical history, Surgical history, Family history not pertinant except as noted below, Social history, Allergies, and medications have been entered into the medical record, reviewed, and corrections made.   Review of Systems: See HPI for pertinent positives and negatives.   Objective:    General: Speaking clearly in complete sentences without any shortness of breath.  Alert and oriented x3.  Normal judgment. No apparent acute distress.  Impression and Recommendations:    Hospital discharge follow-up Reviewed available information and [facility] in care everywhere.  MRI report available showing partially empty sella turcica but no other explanation for her new onset neurological symptoms.  Labs reviewed, also no explanation for current symptoms.  At this point, no definitive answer is available and unable to perform an exam due to the virtual nature of this visit.  Strongly recommend making sure to attend the appointment with neurology on 07/14/2021.  Also recommend being evaluated by optometry/ophthalmology for visual changes to rule out issues there.  Sinus tachycardia Updating diagnosis to reflect most recent evaluation, chronic condition.  Migraine syndrome Resume Ajovy as soon as possible.  Continue Ubrelvy daily as needed.  Adding prednisone 50 mg daily x5 to see if this will help with the headache and any inflammation that may be contributing to her symptoms.  Recommend following up with neurology for further evaluation management.   I discussed the assessment and treatment plan with  the patient. The patient was provided an opportunity to ask questions and all were answered. The patient agreed with the plan and demonstrated an understanding of the instructions.   The patient was advised to call back or seek an in-person evaluation if the symptoms worsen or if the condition fails to improve as  anticipated.  30 minutes of non-face-to-face time was provided during this encounter.  Return if symptoms worsen or fail to improve.  Clearnce Sorrel, DNP, APRN, FNP-BC [facility]

## 2021-07-09 ENCOUNTER — Encounter: Payer: Self-pay | Admitting: Medical-Surgical

## 2021-07-09 ENCOUNTER — Telehealth (INDEPENDENT_AMBULATORY_CARE_PROVIDER_SITE_OTHER): Payer: Managed Care, Other (non HMO) | Admitting: Medical-Surgical

## 2021-07-09 DIAGNOSIS — Z09 Encounter for follow-up examination after completed treatment for conditions other than malignant neoplasm: Secondary | ICD-10-CM | POA: Diagnosis not present

## 2021-07-09 DIAGNOSIS — R000 Tachycardia, unspecified: Secondary | ICD-10-CM | POA: Diagnosis not present

## 2021-07-09 DIAGNOSIS — G43909 Migraine, unspecified, not intractable, without status migrainosus: Secondary | ICD-10-CM

## 2021-07-09 MED ORDER — PREDNISONE 50 MG PO TABS: 50.0000 mg | ORAL_TABLET | Freq: Every day | ORAL | 0 refills | Status: DC

## 2021-07-09 NOTE — Assessment & Plan Note (Signed)
Resume Ajovy as soon as possible.  Continue Ubrelvy daily as needed.  Adding prednisone 50 mg daily x5 to see if this will help with the headache and any inflammation that may be contributing to her symptoms.  Recommend following up with neurology for further evaluation management.

## 2021-07-09 NOTE — Assessment & Plan Note (Addendum)
Updating diagnosis to reflect most recent evaluation, chronic condition.

## 2021-07-09 NOTE — Assessment & Plan Note (Signed)
Reviewed available information and CHL in care everywhere.  MRI report available showing partially empty sella turcica but no other explanation for her new onset neurological symptoms.  Labs reviewed, also no explanation for current symptoms.  At this point, no definitive answer is available and unable to perform an exam due to the virtual nature of this visit.  Strongly recommend making sure to attend the appointment with neurology on 07/14/2021.  Also recommend being evaluated by optometry/ophthalmology for visual changes to rule out issues there.

## 2021-07-14 ENCOUNTER — Ambulatory Visit (INDEPENDENT_AMBULATORY_CARE_PROVIDER_SITE_OTHER): Payer: Managed Care, Other (non HMO) | Admitting: Neurology

## 2021-07-14 ENCOUNTER — Ambulatory Visit
Admission: RE | Admit: 2021-07-14 | Discharge: 2021-07-14 | Disposition: A | Discharge status: Home / Self Care | Payer: Managed Care, Other (non HMO) | Source: Ambulatory Visit | Attending: Sports Medicine | Admitting: Sports Medicine

## 2021-07-14 ENCOUNTER — Encounter: Payer: Self-pay | Admitting: Neurology

## 2021-07-14 VITALS — BP 144/97 | HR 89 | Ht 65.0 in | Wt 252.0 lb

## 2021-07-14 DIAGNOSIS — R531 Weakness: Secondary | ICD-10-CM

## 2021-07-14 DIAGNOSIS — G43709 Chronic migraine without aura, not intractable, without status migrainosus: Secondary | ICD-10-CM | POA: Diagnosis not present

## 2021-07-14 DIAGNOSIS — R269 Unspecified abnormalities of gait and mobility: Secondary | ICD-10-CM

## 2021-07-14 DIAGNOSIS — M5030 Other cervical disc degeneration, unspecified cervical region: Secondary | ICD-10-CM

## 2021-07-14 MED ORDER — IOPAMIDOL (ISOVUE-M 300) INJECTION 61%: 1.0000 mL | Freq: Once | INTRAMUSCULAR | Status: DC | PRN

## 2021-07-14 MED ORDER — TRIAMCINOLONE ACETONIDE 40 MG/ML IJ SUSP (RADIOLOGY): 80.0000 mg | Freq: Once | INTRAMUSCULAR | Status: DC

## 2021-07-14 NOTE — Progress Notes (Addendum)
Chief Complaint  Patient presents with   New Patient (Initial Visit)    Rm 14. Accompanied by wife. NP/ED referral for generalized weakness with intermittent numbness/muscle spasm of UE and LE.      ASSESSMENT AND PLAN  Beth Lane is a 36 y.o. female   Syncope episode on Jul 05, 2021, generalized weakness  In the setting of decreased p.o. 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 Addendum: Ophthalmology evaluation by Dr. Lyn Records on July 28, 2021, visual field is severely constricted OS, slight GRS OD, OCT normal, no evidence of papilledema, concern is about something involving left nerve, will repeat study in [redacted] weeks along with refraction, the exam was performed after pupil was already dilated,  Diplopia sounds vertigo, multiple times during the day, but not consistent, difficult to measure given limited vision,  Repeat 24 2 visual field/OCT-N  MEDICAL HISTORY:  Beth Lane, is a 36 year old, accompanied by patient's wife, seen in request by nurse practitioner [name] for evaluation of weakness, initial evaluation was on Jul 14, 2021  I reviewed and summarized the referring note. PMHX. Chronic migraine ADHD. Depression,anxiey Fibromyalgia.  Muscle pain  Patient reported long history of chronic migraine headaches, usually starting at occipital region, spreading forward to become vortex, bilateral retro-orbital pressure pain, lasting hours to days, it can  happen once a week, for a while, her headache is under excellent control taking her Ajovy, but since November 2022, she had increased frequency of headaches, taking Bernita Raisin as needed usually works well, but sometimes headache can lasting for hours to days  Occasionally for migraine headache is associated with blurry vision, she also reported episode of conversion spell in 2000 08, presenting with right-sided sensory loss for 2 months,  She has not worked over the past 14 years, but not on disability  She presented to emergency room on Jul 06, 2021, wife reported that she has not eating or drinking much the day before, woke up in the middle of the night using bathroom, she felt weakness, fainting sensation, wife in sleep sleep heart a loud thump, patient was on the floor, no loss of consciousness, no incontinence, she needs to be helped to using bathroom, and then go back to her bed,  She presented to emergency room next day Jul 06, 2021,  Personally reviewed MRI of the brain without contrast, partially empty sella turcica, no intracranial acute abnormality, small volume fluid within the left mastoid air cells  Laboratory evaluation showed normal CBC hemoglobin of 13.5, mildly low potassium 3.4, glucose 114, creatinine 0.71, normal magnesium, UA showed trace leukocyte, positive nitrite, UDS was positive for amphetamine  She complains of intermittent blurry vision and before the onset of the headaches and during intense headache, has not seen ophthalmologist for few years  PHYSICAL EXAM:   Vitals:   07/14/21 1505  BP: (!) 144/97  Pulse: 89  Weight: 252 lb (114.3 kg)  Height: 5\' 5"  (1.651 m)   Not recorded     Body mass index is 41.93 kg/m.  PHYSICAL EXAMNIATION:  Gen: NAD, conversant,  well nourised, well groomed                     Cardiovascular: Regular rate rhythm, no peripheral edema, warm, nontender. Eyes: Conjunctivae clear without exudates or hemorrhage Neck: Supple, no  carotid bruits. Pulmonary: Clear to auscultation bilaterally   NEUROLOGICAL EXAM:  MENTAL STATUS: Speech/cognition: Awake, alert, oriented to history taking and casual conversation CRANIAL NERVES: CN II: Visual fields are full to confrontation. Pupils are round equal and briskly reactive to light.  Funduscopy examination showed mild blurry edge of bilateral optic nerve head CN III, IV, VI: extraocular movement are normal. No ptosis. CN V: Facial sensation is intact to light touch CN VII: Face is symmetric with normal eye closure  CN VIII: Hearing is normal to causal conversation. CN IX, X: Phonation is normal. CN XI: Head turning and shoulder shrug are intact  MOTOR: There is no pronator drift of out-stretched arms. Muscle bulk and tone are normal. Muscle strength is normal.  REFLEXES: Reflexes are 2+ and symmetric at the biceps, triceps, knees, and ankles. Plantar responses are flexor.  SENSORY: Intact to light touch, pinprick and vibratory sensation are intact in fingers and toes.  COORDINATION: There is no trunk or limb dysmetria noted.  GAIT/STANCE:   She needs push-up to get up from seated position, mildly antalgic, uses cane,  REVIEW OF SYSTEMS:  Full 14 system review of systems performed and notable only for as above All other review of systems were negative.   ALLERGIES: Allergies  Allergen Reactions   Sumatriptan Anaphylaxis   Barium Sulfate Nausea And Vomiting   Tape Rash    HOME MEDICATIONS: Current Outpatient Medications  Medication Sig Dispense Refill   ADVAIR DISKUS 250-50 MCG/ACT AEPB INHALE 1 PUFF INTO THE LUNGS IN THE MORNING AND AT BEDTIME 60 each 2   AJOVY 225 MG/1.5ML SOSY INJECT 225 MG UNDER THE SKIN EVERY 30 DAYS 1.5 mL 2   diclofenac Sodium (VOLTAREN) 1 % GEL SMARTSIG:2 Gram(s) Topical 3 Times Daily PRN     esomeprazole (NEXIUM) 40 MG capsule TAKE ONE CAPSULE BY MOUTH DAILY AT NOON 90 capsule 1   Lido-Capsaicin-Men-Methyl Sal (1ST MEDX-PATCH/  LIDOCAINE) 4-0.025-5-20 % PTCH Apply topically.     lidocaine-prilocaine (EMLA) cream SMARTSIG:2 Gram(s) Topical 3 Times Daily PRN     lisdexamfetamine (VYVANSE) 40 MG capsule Take 1 capsule (40 mg total) by mouth every morning. 30 capsule 0   lisdexamfetamine (VYVANSE) 40 MG capsule Take 1 capsule (40 mg total) by mouth every morning. 30 capsule 0   methocarbamol (ROBAXIN) 750 MG tablet Take 1 tablet (750 mg total) by mouth every 6 (six) hours as needed. 360 tablet 3   MOVANTIK 25 MG TABS tablet Take 25 mg by mouth every morning.     Oxycodone HCl 10 MG TABS Take 10 mg by mouth every 6 (six) hours as needed.     PARoxetine (PAXIL) 30 MG tablet Take 1 tablet (30 mg total) by mouth daily. 90 tablet 1   prazosin (MINIPRESS) 1 MG capsule Take 3 capsules (3 mg total) by mouth at bedtime. 270 capsule 1   predniSONE (DELTASONE) 50 MG tablet Take 1 tablet (50 mg total) by mouth daily. 5 tablet 0   pregabalin (LYRICA) 150 MG capsule Take 1 capsule (150 mg total) by mouth 3 (three) times daily.     Ubrogepant (UBRELVY) 50 MG TABS Take 1 tablet by mouth as needed (for migraine). 10 tablet 3   VYVANSE 40 MG capsule  Take 1 capsule (40 mg total) by mouth every morning. 30 capsule 0   No current facility-administered medications for this visit.   Facility-Administered Medications Ordered in Other Visits  Medication Dose Route Frequency Provider Last Rate Last Admin   iopamidol (ISOVUE-M) 61 % intrathecal injection 1 mL  1 mL Intrathecal Once PRN Monica Becton, MD       triamcinolone acetonide (KENALOG-40) injection (RADIOLOGY ONLY) 80 mg  80 mg Intra-articular Once Monica Becton, MD        PAST MEDICAL HISTORY: Past Medical History:  Diagnosis Date   Allergy    Imatrex and plastic tape   Arthritis    In knees, back, wrist, neck   Asthma    At age 76   Chronic pain    Clotting disorder (HCC)    Rectal bleeding at times, hemroids, ulcer   Depression    At age 62 but worse after  my son died in 2009/06/28   Fibromyalgia    Generalized anxiety disorder    GERD (gastroesophageal reflux disease)    Hypertension    About 4 years ago   Migraine syndrome    Osteoporosis    I tend to fracture bones easily and doctor wanted a bone density test in Jun 29, 2010 but found out I was pregnant and was never followed up with   PTSD (post-traumatic stress disorder)    Scoliosis    Stroke (HCC)    Body reacted as a stroke but not an actual stroke. 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 Lost feeling and control on right side   Ulcer     PAST SURGICAL HISTORY: Past Surgical History:  Procedure Laterality Date   CESAREAN SECTION     CHOLECYSTECTOMY     TUBAL LIGATION      FAMILY HISTORY: Family History  Problem Relation Age of Onset   Hypertension Mother    Cancer Mother    Bipolar disorder Mother    Fibromyalgia Mother    ADD / ADHD Mother    Anxiety disorder Mother    Arthritis Mother    COPD Mother    Depression Mother    Diabetes Mother    Obesity Mother    Stroke Father    Post-traumatic stress disorder Father    Heart attack Father    ADD / ADHD Father    Anxiety disorder Father    Asthma Father    Alcohol abuse Maternal Grandfather    ADD / ADHD Daughter    ADD / ADHD Son    Anxiety disorder Son    ADD / ADHD Brother    ADD / ADHD Sister    Anxiety disorder Son    Intellectual disability Son    Anxiety disorder Son    Hearing loss Son    Learning disabilities Son    Early death Son    Learning disabilities Son    Miscarriages / [place] Sister     SOCIAL HISTORY: Social History   Socioeconomic History   Marital status: Married    Spouse name: Not on file   Number of children: Not on file   Years of education: Not on file   Highest education level: Not on file  Occupational History   Not on file  Tobacco Use   Smoking status: Every Day    Types: E-cigarettes   Smokeless tobacco: Never  Vaping Use   Vaping Use: Every day   Substance and Sexual Activity   Alcohol  use: Never   Drug use: Never   Sexual activity: Yes    Birth control/protection: Other-see comments    Comment: Lesbian  Other Topics Concern   Not on file  Social History Narrative   Not on file   Social Determinants of Health   Financial Resource Strain: Not on file  Food Insecurity: Not on file  Transportation Needs: Not on file  Physical Activity: Not on file  Stress: Not on file  Social Connections: Not on file  Intimate Partner Violence: Not on file      Levert Feinstein, M.D. Ph.D.  [facility] [address] [place], [place] [address] Ph: [phone] Fax: [phone]  CC:  [name], NP [address] [place],  [place] [address]  [name], NP

## 2021-07-14 NOTE — Discharge Instructions (Signed)

## 2021-07-15 ENCOUNTER — Encounter (HOSPITAL_COMMUNITY): Payer: Self-pay | Admitting: Psychiatry

## 2021-07-15 ENCOUNTER — Telehealth (INDEPENDENT_AMBULATORY_CARE_PROVIDER_SITE_OTHER): Payer: 59 | Admitting: Psychiatry

## 2021-07-15 ENCOUNTER — Telehealth: Payer: Self-pay | Admitting: Neurology

## 2021-07-15 ENCOUNTER — Encounter (INDEPENDENT_AMBULATORY_CARE_PROVIDER_SITE_OTHER): Payer: Medicaid Other | Admitting: Neurology

## 2021-07-15 ENCOUNTER — Encounter: Payer: Self-pay | Admitting: Medical-Surgical

## 2021-07-15 DIAGNOSIS — F411 Generalized anxiety disorder: Secondary | ICD-10-CM

## 2021-07-15 DIAGNOSIS — R531 Weakness: Secondary | ICD-10-CM

## 2021-07-15 DIAGNOSIS — F331 Major depressive disorder, recurrent, moderate: Secondary | ICD-10-CM | POA: Diagnosis not present

## 2021-07-15 DIAGNOSIS — F4310 Post-traumatic stress disorder, unspecified: Secondary | ICD-10-CM | POA: Diagnosis not present

## 2021-07-15 DIAGNOSIS — R899 Unspecified abnormal finding in specimens from other organs, systems and tissues: Secondary | ICD-10-CM

## 2021-07-15 MED ORDER — PAROXETINE HCL 40 MG PO TABS
40.0000 mg | ORAL_TABLET | Freq: Every day | ORAL | 0 refills | Status: DC
Start: 2021-07-15 — End: 2021-08-10
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
## 2021-07-15 NOTE — Telephone Encounter (Signed)
Referral for Ophthalmology sent to [facility] [phone]. 

## 2021-07-16 ENCOUNTER — Ambulatory Visit (INDEPENDENT_AMBULATORY_CARE_PROVIDER_SITE_OTHER): Payer: 59 | Admitting: Licensed Clinical Social Worker

## 2021-07-16 ENCOUNTER — Encounter (HOSPITAL_COMMUNITY): Payer: Self-pay

## 2021-07-16 DIAGNOSIS — F411 Generalized anxiety disorder: Secondary | ICD-10-CM

## 2021-07-16 DIAGNOSIS — F331 Major depressive disorder, recurrent, moderate: Secondary | ICD-10-CM

## 2021-07-16 DIAGNOSIS — F4310 Post-traumatic stress disorder, unspecified: Secondary | ICD-10-CM | POA: Diagnosis not present

## 2021-07-16 LAB — TSH: TSH: 2.17 u[IU]/mL (ref 0.450–4.500)

## 2021-07-16 LAB — SEDIMENTATION RATE: Sed Rate: 2 mm/hr (ref 0–32)

## 2021-07-16 LAB — ANA W/REFLEX IF POSITIVE: Anti Nuclear Antibody (ANA): NEGATIVE

## 2021-07-16 LAB — CK: Total CK: 30 U/L — ABNORMAL LOW (ref 32–182)

## 2021-07-16 LAB — C-REACTIVE PROTEIN: CRP: <1 mg/L (ref 0–10)

## 2021-07-16 LAB — HGB A1C W/O EAG: Hgb A1c MFr Bld: 4.7 % — ABNORMAL LOW (ref 4.8–5.6)

## 2021-07-16 NOTE — Plan of Care (Signed)
  Problem: Depression CCP Problem  1 learn coping skills to decrease depression Goal:  Skills to increase self-esteem manage PTSD and anxiety Description: Reports severe depression does not have a job to go to anymore Outcome: Not Progressing Goal: reduce PTSD symptoms and anxiety Outcome: Not Progressing Goal: strengthen self-esteem Outcome: Not Progressing Goal: LTG: Reduce frequency, intensity, and duration of depression symptoms as evidenced by: SSB input needed on appropriate metric Outcome: Not Progressing Goal: LTG: Charlea "Chris" WILL SCORE LESS THAN 10 ON THE PATIENT HEALTH QUESTIONNAIRE (PHQ-9) Outcome: Not Progressing Goal: STG: Judaea "Chris" WILL IDENTIFY 5 COGNITIVE PATTERNS AND BELIEFS THAT SUPPORT DEPRESSION Outcome: Not Progressing

## 2021-07-16 NOTE — Progress Notes (Signed)
Virtual Visit via Video Note  I connected with Beth Lane on 07/16/21 at  1:00 PM EDT by a video enabled telemedicine application and verified that I am speaking with the correct person using two identifiers.  Location: Patient: home Provider: home office   I discussed the limitations of evaluation and management by telemedicine and the availability of in person appointments. The patient expressed understanding and agreed to proceed.   I discussed the assessment and treatment plan with the patient. The patient was provided an opportunity to ask questions and all were answered. The patient agreed with the plan and demonstrated an understanding of the instructions.   The patient was advised to call back or seek an in-person evaluation if the symptoms worsen or if the condition fails to improve as anticipated.  I provided 52 minutes of non-face-to-face time during this encounter.  THERAPIST PROGRESS NOTE  Session Time: 1:00 PM to 1:52 PM  Participation Level: Active  Behavioral Response: CasualAlertDepressed  Type of Therapy: Individual Therapy  Treatment Goals addressed: Depression, anxiety, self-esteem, PTSD, coping  ProgressTowards Goals: Initial  Interventions: Solution Focused, Strength-based, Supportive, Reframing, and Other: Coping  Summary: Beth Lane is a 36 y.o. female who presents saying needs to continue therapy Dr. Benjaman Pott at least weekly with her severity of depression.  Worked on completing treatment plan noted source of depression of not being able to go back to work limited with physical issues.  Therapist talked about reinventing at different phases in her life and patient said cannot do anything with anything she has been trained to, do not have time to go back to school to be retrained can't write with hands pain in hands ruins everything for her very discouraging. Need help managing time maybe slow down to start over. Tested for  autism. Same issues as boys.  Assesses well working with patient on stress management behavior issues with boys as well as issues identified when completed treatment plan and patient gave consent to complete virtually  Therapist finished paperwork for assessment and treatment plan.  Assess helpful as were able to identify some of the sources of depression to focus on including patient not being able to go back to work for physical issues.  Therapist assesses need to work with patient on ways to adjust up to just occurred in life although validated at the same time it is hard.  Noted pain management is an issue for patient, will give her resources on autism.  Therapist provided patient with support and space to process thoughts and feelings in session worked on Public relations account executive.  Highlighted patient's strengths will be helpful and working on symptoms including living kids, doing well in school using her intelligence possibly and ways would be rewarding for her.  Suicidal/Homicidal: No  Plan: 1.patient had to cancel next 2 appointments so we will start in July patient will be seen weekly 2.  Provide resources for psychological testing patient interested in finding out whether has autism.  Begin work on depression   Diagnosis: Major depressive disorder, recurrent, moderate, PTSD, generalized anxiety disorder  Collaboration of Care: Medication Management AEB review of Dr. Gilmore Laroche note  Patient/Guardian was advised Release of Information must be obtained prior to any record release in order to collaborate their care with an outside provider. Patient/Guardian was advised if they have not already done so to contact the registration department to sign all necessary forms in order for [place] to release information regarding their care.   Consent: Patient/Guardian gives  verbal consent for treatment and assignment of benefits for services provided during this visit. Patient/Guardian expressed understanding  and agreed to proceed.   Coolidge Breeze, LCSW 07/16/2021

## 2021-07-16 NOTE — Plan of Care (Signed)
Patient participated in creating treatment plan ?

## 2021-07-20 ENCOUNTER — Encounter: Payer: Self-pay | Admitting: Neurology

## 2021-07-20 DIAGNOSIS — R899 Unspecified abnormal finding in specimens from other organs, systems and tissues: Secondary | ICD-10-CM | POA: Insufficient documentation

## 2021-07-20 NOTE — Telephone Encounter (Signed)
Please see the MyChart message reply(ies) for my assessment and plan.    This patient gave consent for this Medical Advice Message and is aware that it may result in a bill to [employer], as well as the possibility of receiving a bill for a co-payment or deductible. They are an established patient, but are not seeking medical advice exclusively about a problem treated during an in person or video visit in the last seven days. I did not recommend an in person or video visit within seven days of my reply.    I spent a total of 15 minutes cumulative time within 7 days through [employer].  Marcial Pacas, MD  PhD.

## 2021-07-21 ENCOUNTER — Encounter: Payer: Self-pay | Admitting: Medical-Surgical

## 2021-07-21 ENCOUNTER — Encounter (INDEPENDENT_AMBULATORY_CARE_PROVIDER_SITE_OTHER): Payer: Self-pay | Admitting: Neurology

## 2021-07-21 DIAGNOSIS — R531 Weakness: Secondary | ICD-10-CM

## 2021-07-22 ENCOUNTER — Ambulatory Visit (HOSPITAL_COMMUNITY): Payer: 59 | Admitting: Licensed Clinical Social Worker

## 2021-07-28 LAB — HM DIABETES EYE EXAM

## 2021-07-28 NOTE — Telephone Encounter (Signed)
  Thank you for your message seeking medical advice.* My assessment and recommendation are as follows:  Chris: I personally reviewed MRI of the brain without contrast Jul 06, 2021 showed normal acute intracranial abnormality other than the partially empty sella turcica, please make sure your ophthalmologist fax evaluation to our office to rule out intracranial hypertension.  In addition, extensive laboratory evaluation also showed no significant abnormality, there is decreased A1c 4.7, indicating mildly decreased average glucose level over the past few months.  From reviewing the chart, you are likely very deconditioned over the past few months, it likely will take time to continue regular eating, sleeping schedule, keep up the moderate exercise to help you bounce back to a more healthy level.  Multiple medications that you are taking may also contributed to some of your symptoms.   Sincerely,  Marcial Pacas, MD    *This exchange required the expertise of a doctor, nurse practitioner, physician assistant, optometrist or certified nurse midwife and qualifies as a Medical Advice Message, please visit [email or web address] for more details. [facility] will bill your insurance on your behalf; copays and deductibles may apply. Questions? Reply to this message.

## 2021-07-29 ENCOUNTER — Ambulatory Visit (HOSPITAL_COMMUNITY): Payer: 59 | Admitting: Licensed Clinical Social Worker

## 2021-08-02 ENCOUNTER — Other Ambulatory Visit: Payer: Self-pay | Admitting: Medical-Surgical

## 2021-08-02 DIAGNOSIS — M797 Fibromyalgia: Secondary | ICD-10-CM

## 2021-08-03 ENCOUNTER — Other Ambulatory Visit: Payer: Self-pay

## 2021-08-03 ENCOUNTER — Encounter: Payer: Self-pay | Admitting: Medical-Surgical

## 2021-08-03 DIAGNOSIS — M797 Fibromyalgia: Secondary | ICD-10-CM

## 2021-08-03 MED ORDER — PREGABALIN 150 MG PO CAPS: 150.0000 mg | ORAL_CAPSULE | Freq: Three times a day (TID) | ORAL | 0 refills | Status: DC

## 2021-08-03 MED ORDER — PREGABALIN 150 MG PO CAPS
150.0000 mg | ORAL_CAPSULE | Freq: Three times a day (TID) | ORAL | 0 refills | Status: DC
  Filled 2021-08-03: qty 270, 90d supply, fill #0

## 2021-08-06 ENCOUNTER — Other Ambulatory Visit: Payer: Self-pay | Admitting: Medical-Surgical

## 2021-08-07 ENCOUNTER — Encounter: Payer: Self-pay | Admitting: Medical-Surgical

## 2021-08-07 ENCOUNTER — Encounter: Payer: Self-pay | Admitting: Sports Medicine

## 2021-08-07 ENCOUNTER — Other Ambulatory Visit (HOSPITAL_COMMUNITY): Payer: Self-pay | Admitting: Psychiatry

## 2021-08-07 NOTE — Telephone Encounter (Signed)
Vyvanse last written 07/01/2021 for one month.  Last appt 07/09/2021

## 2021-08-08 MED ORDER — LISDEXAMFETAMINE DIMESYLATE 40 MG PO CAPS: 40.0000 mg | ORAL_CAPSULE | ORAL | 0 refills | Status: DC

## 2021-08-10 NOTE — Telephone Encounter (Signed)
Patient has been scheduled for a virtual visit on 7/18 with Joy. AMUCK

## 2021-08-13 ENCOUNTER — Encounter: Payer: Self-pay | Admitting: Medical-Surgical

## 2021-08-14 NOTE — Telephone Encounter (Signed)
LMVM for the patient to pick up completed form at the front desk at her convenience.

## 2021-08-19 ENCOUNTER — Encounter: Payer: Self-pay | Admitting: Medical-Surgical

## 2021-08-24 ENCOUNTER — Ambulatory Visit (HOSPITAL_COMMUNITY): Payer: 59 | Admitting: Licensed Clinical Social Worker

## 2021-08-24 ENCOUNTER — Ambulatory Visit: Payer: Self-pay | Admitting: Sports Medicine

## 2021-09-01 ENCOUNTER — Telehealth: Payer: Self-pay | Admitting: Medical-Surgical

## 2021-09-02 ENCOUNTER — Other Ambulatory Visit: Payer: Self-pay | Admitting: Medical-Surgical

## 2021-09-03 ENCOUNTER — Encounter (HOSPITAL_COMMUNITY): Payer: Self-pay

## 2021-09-03 ENCOUNTER — Encounter: Payer: Self-pay | Admitting: Medical-Surgical

## 2021-09-03 ENCOUNTER — Ambulatory Visit (HOSPITAL_COMMUNITY): Payer: 59 | Admitting: Licensed Clinical Social Worker

## 2021-09-03 DIAGNOSIS — G47419 Narcolepsy without cataplexy: Secondary | ICD-10-CM

## 2021-09-03 DIAGNOSIS — R2990 Unspecified symptoms and signs involving the nervous system: Secondary | ICD-10-CM

## 2021-09-04 ENCOUNTER — Telehealth: Payer: Self-pay | Admitting: Medical-Surgical

## 2021-09-07 ENCOUNTER — Other Ambulatory Visit (HOSPITAL_COMMUNITY): Payer: Self-pay | Admitting: Psychiatry

## 2021-09-10 ENCOUNTER — Ambulatory Visit (INDEPENDENT_AMBULATORY_CARE_PROVIDER_SITE_OTHER): Payer: Commercial Managed Care - HMO | Admitting: Licensed Clinical Social Worker

## 2021-09-10 ENCOUNTER — Encounter: Payer: Self-pay | Admitting: Medical-Surgical

## 2021-09-10 DIAGNOSIS — F331 Major depressive disorder, recurrent, moderate: Secondary | ICD-10-CM | POA: Diagnosis not present

## 2021-09-10 DIAGNOSIS — F4310 Post-traumatic stress disorder, unspecified: Secondary | ICD-10-CM

## 2021-09-10 DIAGNOSIS — F411 Generalized anxiety disorder: Secondary | ICD-10-CM | POA: Diagnosis not present
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
## 2021-09-14 ENCOUNTER — Encounter (HOSPITAL_COMMUNITY): Payer: Self-pay

## 2021-09-14 ENCOUNTER — Telehealth (HOSPITAL_COMMUNITY): Payer: 59 | Admitting: Psychiatry

## 2021-09-17 ENCOUNTER — Ambulatory Visit (HOSPITAL_COMMUNITY): Payer: 59 | Admitting: Licensed Clinical Social Worker

## 2021-09-17 DIAGNOSIS — F411 Generalized anxiety disorder: Secondary | ICD-10-CM

## 2021-09-17 DIAGNOSIS — F331 Major depressive disorder, recurrent, moderate: Secondary | ICD-10-CM

## 2021-09-17 DIAGNOSIS — F4310 Post-traumatic stress disorder, unspecified: Secondary | ICD-10-CM

## 2021-09-17 NOTE — Progress Notes (Signed)
Therapist contacted patient through text for a session and she did not respond session is a no show

## 2021-09-20 ENCOUNTER — Other Ambulatory Visit: Payer: Self-pay | Admitting: Medical-Surgical

## 2021-09-21 ENCOUNTER — Telehealth: Payer: Medicaid Other | Admitting: Medical-Surgical

## 2021-09-21 MED ORDER — LISDEXAMFETAMINE DIMESYLATE 40 MG PO CAPS: 40.0000 mg | ORAL_CAPSULE | ORAL | 0 refills | Status: DC

## 2021-09-21 NOTE — Telephone Encounter (Signed)
Last OV: 07/09/21 Next OV: 10/16/21 Last RF: 08/08/21

## 2021-09-29 ENCOUNTER — Other Ambulatory Visit: Payer: Self-pay | Admitting: Medical-Surgical

## 2021-09-30 NOTE — Telephone Encounter (Signed)
Last video visit 07/09/2021  Last filled 03/16/2021  Future visit 10/16/2021

## 2021-10-07 ENCOUNTER — Ambulatory Visit (HOSPITAL_COMMUNITY): Payer: 59 | Admitting: Licensed Clinical Social Worker

## 2021-10-07 ENCOUNTER — Encounter (HOSPITAL_COMMUNITY): Payer: Self-pay

## 2021-10-07 DIAGNOSIS — F4310 Post-traumatic stress disorder, unspecified: Secondary | ICD-10-CM

## 2021-10-07 DIAGNOSIS — F331 Major depressive disorder, recurrent, moderate: Secondary | ICD-10-CM

## 2021-10-07 DIAGNOSIS — F411 Generalized anxiety disorder: Secondary | ICD-10-CM

## 2021-10-07 NOTE — Progress Notes (Signed)
Therapist contacted patient through text and she did not respond session is a no show. 

## 2021-10-13 NOTE — Progress Notes (Unsigned)
[facility] [address] [place]. [place] [address] [phone]       OFFICE FOLLOW UP NOTE  Ms. Fynley Lennox Grumbles Date of Birth:  09/30/1985 Medical Record Number:  [id number]   Reason for visit:     SUBJECTIVE:   CHIEF COMPLAINT:  No chief complaint on file.   HPI:   Beth Lane, is a 36 year old, accompanied by patient's wife, seen in request by nurse practitioner [name] for evaluation of weakness, initial evaluation was on Jul 14, 2021 with Dr. Terrace Arabia   I reviewed and summarized the referring note. PMHX. Chronic migraine ADHD. Depression,anxiey Fibromyalgia.  Muscle pain  Consult visit 07/14/2021 Dr. Terrace Arabia: Patient reported long history of chronic migraine headaches, usually starting at occipital region, spreading forward to become vortex, bilateral retro-orbital pressure pain, lasting hours to days, it can happen once a week, for a while, her headache is under excellent control taking her Ajovy, but since November 2022, she had increased frequency of headaches, taking Bernita Raisin as needed usually works well, but sometimes headache can lasting for hours to days  Occasionally for migraine headache is associated with blurry vision, she also reported episode of conversion spell in 2000 08, presenting with right-sided sensory loss for 2 months,  She has not worked over the past 14 years, but not on disability  She presented to emergency room on Jul 06, 2021, wife reported that she has not eating or drinking much the day before, woke up in the middle of the night using bathroom, she felt weakness, fainting sensation, wife in sleep sleep heart a loud thump, patient was on the floor, no loss of consciousness, no incontinence, she needs to be helped to using bathroom, and then go back to her bed,  She presented to emergency room next day Jul 06, 2021,  Personally reviewed MRI of the brain without contrast, partially empty sella  turcica, no intracranial acute abnormality, small volume fluid within the left mastoid air cells  Laboratory evaluation showed normal CBC hemoglobin of 13.5, mildly low potassium 3.4, glucose 114, creatinine 0.71, normal magnesium, UA showed trace leukocyte, positive nitrite, UDS was positive for amphetamine  She complains of intermittent blurry vision and before the onset of the headaches and during intense headache, has not seen ophthalmologist for few years   Update 10/14/2021 JM: Patient returns for 53-month follow-up.  She was seen by Dr. Dione Booze without evidence of papilledema but did show severely constricted OS with concern of something involving left nerve ***.         ROS:   14 system review of systems performed and negative with exception of ***  PMH:  Past Medical History:  Diagnosis Date   Allergy    Imatrex and plastic tape   Arthritis    In knees, back, wrist, neck   Asthma    At age 30   Chronic pain    Clotting disorder (HCC)    Rectal bleeding at times, hemroids, ulcer   Depression    At age 64 but worse after my son died in 2009-06-20   Fibromyalgia    Generalized anxiety disorder    GERD (gastroesophageal reflux disease)    Hypertension    About 4 years ago   Migraine syndrome    Osteoporosis    I tend to fracture bones easily and doctor wanted a bone density test in 06-21-10 but found out I was pregnant and was never followed up with   PTSD (post-traumatic stress disorder)  Scoliosis    Stroke (HCC)    Body reacted as a stroke but not an actual stroke. 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 Lost feeling and control on right side   Ulcer     PSH:  Past Surgical History:  Procedure Laterality Date   CESAREAN SECTION     CHOLECYSTECTOMY     TUBAL LIGATION      Social History:  Social History   Socioeconomic History   Marital status: Married    Spouse name: Not on file   Number of children: Not on file   Years of education: Not on file    Highest education level: Not on file  Occupational History   Not on file  Tobacco Use   Smoking status: Every Day    Types: E-cigarettes   Smokeless tobacco: Never  Vaping Use   Vaping Use: Every day  Substance and Sexual Activity   Alcohol use: Never   Drug use: Never   Sexual activity: Yes    Birth control/protection: Other-see comments    Comment: Lesbian  Other Topics Concern   Not on file  Social History Narrative   Not on file   Social Determinants of Health   Financial Resource Strain: Not on file  Food Insecurity: Not on file  Transportation Needs: Not on file  Physical Activity: Not on file  Stress: Not on file  Social Connections: Not on file  Intimate Partner Violence: Not on file    Family History:  Family History  Problem Relation Age of Onset   Hypertension Mother    Cancer Mother    Bipolar disorder Mother    Fibromyalgia Mother    ADD / ADHD Mother    Anxiety disorder Mother    Arthritis Mother    COPD Mother    Depression Mother    Diabetes Mother    Obesity Mother    Stroke Father    Post-traumatic stress disorder Father    Heart attack Father    ADD / ADHD Father    Anxiety disorder Father    Asthma Father    Alcohol abuse Maternal Grandfather    ADD / ADHD Daughter    ADD / ADHD Son    Anxiety disorder Son    ADD / ADHD Brother    ADD / ADHD Sister    Anxiety disorder Son    Intellectual disability Son    Anxiety disorder Son    Hearing loss Son    Learning disabilities Son    Early death Son    Learning disabilities Son    Miscarriages / [place] Sister     Medications:   Current Outpatient Medications on File Prior to Visit  Medication Sig Dispense Refill   ADVAIR DISKUS 250-50 MCG/ACT AEPB INHALE 1 PUFF INTO THE LUNGS IN THE MORNING AND AT BEDTIME 60 each 2   AJOVY 225 MG/1.5ML SOSY INJECT 225 MG UNDER THE SKIN EVERY 30 DAYS 1.5 mL 2   diclofenac Sodium (VOLTAREN) 1 % GEL SMARTSIG:2 Gram(s) Topical 3 Times Daily PRN      esomeprazole (NEXIUM) 40 MG capsule TAKE ONE CAPSULE BY MOUTH DAILY AT NOON 90 capsule 1   Lido-Capsaicin-Men-Methyl Sal (1ST MEDX-PATCH/ LIDOCAINE) 4-0.025-5-20 % PTCH Apply topically.     lidocaine-prilocaine (EMLA) cream SMARTSIG:2 Gram(s) Topical 3 Times Daily PRN     lisdexamfetamine (VYVANSE) 40 MG capsule Take 1 capsule (40 mg total) by mouth every morning. 30 capsule 0   lisdexamfetamine (VYVANSE)  40 MG capsule Take 1 capsule (40 mg total) by mouth every morning. 30 capsule 0   methocarbamol (ROBAXIN) 750 MG tablet Take 1 tablet (750 mg total) by mouth every 6 (six) hours as needed. 360 tablet 3   MOVANTIK 25 MG TABS tablet Take 25 mg by mouth every morning.     Oxycodone HCl 10 MG TABS Take 10 mg by mouth every 6 (six) hours as needed.     PARoxetine (PAXIL) 40 MG tablet TAKE 1 TABLET(40 MG) BY MOUTH DAILY 30 tablet 0   prazosin (MINIPRESS) 1 MG capsule TAKE 3 CAPSULES(3 MG) BY MOUTH AT BEDTIME 270 capsule 1   predniSONE (DELTASONE) 50 MG tablet Take 1 tablet (50 mg total) by mouth daily. 5 tablet 0   pregabalin (LYRICA) 150 MG capsule Take 1 capsule (150 mg total) by mouth 3 (three) times daily. 270 capsule 0   UBRELVY 50 MG TABS TAKE 1 TABLET BY MOUTH AS NEEDED FOR MIGRAINE 10 tablet 3   VYVANSE 40 MG capsule Take 1 capsule (40 mg total) by mouth every morning. 30 capsule 0   No current facility-administered medications on file prior to visit.    Allergies:   Allergies  Allergen Reactions   Sumatriptan Anaphylaxis   Barium Sulfate Nausea And Vomiting   Tape Rash      OBJECTIVE:  Physical Exam  There were no vitals filed for this visit. There is no height or weight on file to calculate BMI. No results found.   General: well developed, well nourished, seated, in no evident distress Head: head normocephalic and atraumatic.   Neck: supple with no carotid or supraclavicular bruits Cardiovascular: regular rate and rhythm, no murmurs Musculoskeletal: no  deformity Skin:  no rash/petichiae Vascular:  Normal pulses all extremities   Neurologic Exam Mental Status: Awake and fully alert. Oriented to place and time. Recent and remote memory intact. Attention span, concentration and fund of knowledge appropriate. Mood and affect appropriate.  Cranial Nerves: Pupils equal, briskly reactive to light. Extraocular movements full without nystagmus. Visual fields full to confrontation. Hearing intact. Facial sensation intact. Face, tongue, palate moves normally and symmetrically.  Motor: Normal bulk and tone. Normal strength in all tested extremity muscles Sensory.: intact to touch , pinprick , position and vibratory sensation.  Coordination: Rapid alternating movements normal in all extremities. Finger-to-nose and heel-to-shin performed accurately bilaterally. Gait and Station: Arises from chair without difficulty. Stance is normal. Gait demonstrates normal stride length and balance without use of AD. Tandem walk and heel toe without difficulty.  Reflexes: 1+ and symmetric. Toes downgoing.         ASSESSMENT/PLAN: Datha Sherryl Valido is a 36 y.o. year old female   Syncope episode on Jul 05, 2021, generalized weakness             In the setting of decreased p.o. intake, suggested her increase water             Laboratory evaluation including thyroid function, Partial empty sella on MRI of the brain,             She did complains of increased frequency of headache, has obesity, referral to ophthalmology for evaluations to rule out intracranial hypertension, if confirmed may consider fluoroscopy guided lumbar puncture             Return to clinic with nurse practitioner in 3 to 4 months      Follow up in *** or call earlier if needed   CC:  PCP: [name], NP    I spent *** minutes of face-to-face and non-face-to-face time with patient.  This included previsit chart review, lab review, study review, order entry, electronic health  record documentation, patient education regarding ***   Ihor Austin, AGNP-BC  [facility] [address] [place], [place] [address]  Phone [phone] Fax [phone] Note: This document was prepared with digital dictation and possible smart phrase technology. Any transcriptional errors that result from this process are unintentional.

## 2021-10-14 ENCOUNTER — Ambulatory Visit (INDEPENDENT_AMBULATORY_CARE_PROVIDER_SITE_OTHER): Payer: Commercial Managed Care - HMO | Admitting: Adult Health

## 2021-10-14 ENCOUNTER — Encounter: Payer: Self-pay | Admitting: Adult Health

## 2021-10-14 ENCOUNTER — Ambulatory Visit: Payer: Medicaid Other | Admitting: Sports Medicine

## 2021-10-14 ENCOUNTER — Telehealth: Payer: Self-pay | Admitting: Adult Health

## 2021-10-14 VITALS — BP 140/84 | HR 79 | Ht 65.0 in | Wt 248.2 lb

## 2021-10-14 DIAGNOSIS — R55 Syncope and collapse: Secondary | ICD-10-CM

## 2021-10-14 NOTE — Patient Instructions (Signed)
Would recommend reaching out to your pain doctor regarding physical therapy  Please discuss seeing cardiology with your PCP for further evaluation of passing out episodes          Thank you for coming to see [place] at [facility]. I hope we have been able to provide you high quality care today.  You may receive a patient satisfaction survey over the next few weeks. We would appreciate your feedback and comments so that we may continue to improve ourselves and the health of our patients.

## 2021-10-14 NOTE — Telephone Encounter (Signed)
Referral for neurology sent to [facility], phone: [phone]

## 2021-10-16 ENCOUNTER — Telehealth: Payer: Medicaid Other | Admitting: Medical-Surgical

## 2021-10-18 ENCOUNTER — Encounter: Payer: Self-pay | Admitting: Medical-Surgical

## 2021-10-18 DIAGNOSIS — R55 Syncope and collapse: Secondary | ICD-10-CM

## 2021-10-19 ENCOUNTER — Other Ambulatory Visit: Payer: Self-pay | Admitting: Medical-Surgical

## 2021-10-22 NOTE — Addendum Note (Signed)
Addended by: Chalmers Cater on: 10/22/2021 12:08 PM   Modules accepted: Orders

## 2021-10-23 ENCOUNTER — Telehealth: Payer: Medicaid Other | Admitting: Physician Assistant

## 2021-10-23 NOTE — Progress Notes (Signed)
Patient having chest pain that radiates to left arm with SOB. Advised to seek ER

## 2021-10-28 ENCOUNTER — Other Ambulatory Visit: Payer: Self-pay | Admitting: Medical-Surgical

## 2021-10-28 MED ORDER — VYVANSE 40 MG PO CAPS: 40.0000 mg | ORAL_CAPSULE | Freq: Every morning | ORAL | 0 refills | Status: DC

## 2021-10-28 NOTE — Addendum Note (Signed)
Addended byChristen Butter on: 10/28/2021 02:21 PM   Modules accepted: Orders

## 2021-10-29 NOTE — Telephone Encounter (Signed)
Please deny duplicate refill request.  I am not authorized to either refuse or approve refill request for this medication.  Tiajuana Amass, CMA

## 2021-10-29 NOTE — Telephone Encounter (Signed)
I called the pharmacy and they stated that they are currently out of stock and will have it in stock tomorrow.   I have updated the patient.

## 2021-11-07 ENCOUNTER — Other Ambulatory Visit: Payer: Self-pay | Admitting: Sports Medicine

## 2021-11-07 ENCOUNTER — Other Ambulatory Visit: Payer: Self-pay | Admitting: Medical-Surgical

## 2021-11-07 DIAGNOSIS — M797 Fibromyalgia: Secondary | ICD-10-CM

## 2021-11-08 NOTE — Progress Notes (Unsigned)
Virtual Visit via Video Note  I connected with Beth Lane on 11/10/21 at  3:00 PM EDT by a video enabled telemedicine application and verified that I am speaking with the correct person using two identifiers.   I discussed the limitations of evaluation and management by telemedicine and the availability of in person appointments. The patient expressed understanding and agreed to proceed.  Patient location: home Provider locations: office  Subjective:    CC: follow up on syncope  HPI: Pleasant 36 year old female presenting via MyChart video visit for the following:  Follow up on syncope. She has been having multiple syncopal episodes and has been followed by neurology with no etiology having been found. Reports the neurologist is recommending a referral to Cardiology as well as checking her fasting sugar. A referral was placed for Cardiology on 10/20/21. Recent HgbA1c checked with reading of 4.7%. She is interested in having a prescription sent in to see if we can get a glucometer covered by insurance to evaluate fasting sugars at home for possible hypoglycemia.   Narcolepsy: has been taking Vyvanse 40mg  daily to treat narcolepsy. Was referred to neurology for further management and recommendations for treatment of narcolepsy but they have not addressed or taken over management of this issue.   Mood: having a lot of anxiety/depression due to her health as well as her recent loss of employment. Also notes that this time of year is always bad for her (June through October). Has had trouble sleeping due to nightmares. Was seeing counseling but has missed some appointments. Plans to see the counselor again soon. Previously saw Dr. November with psychiatry but after her visit in May, did not have another appointment. Not sure if her medications need to be changed or not. Asking for a refill of Paxil 40mg  daily. Denies SI/HI.  Has an appointment with Cardiology at [facility] scheduled for  November. Previously saw cardiology when she lived in [place] and reports they knew she had some heart issues. In addition, her BP has been all over the place with high readings on most days but some others being normal to low normal.   Has been seeing Urology for her dysuria symptoms. Reports they put her on Keflex for 3 months and she is now out of it. Asking for a refill because she still has symptoms. Did not follow up with them at the 3 month point.   Past medical history, Surgical history, Family history not pertinant except as noted below, Social history, Allergies, and medications have been entered into the medical record, reviewed, and corrections made.   Review of Systems: See HPI for pertinent positives and negatives.   Objective:    General: Speaking clearly in complete sentences without any shortness of breath.  Alert and oriented x3.  Normal judgment. No apparent acute distress.  Impression and Recommendations:    1. Syncope, unspecified syncope type Cardiology referral in place with appointment scheduled. Neurology in place with follow up scheduled. Unclear etiology after extensive workup. Consider possible hypoglycemic episodes. Printed prescription for a glucometer sent to the pharmacy on file.   2. Primary narcolepsy without cataplexy Continue Vyvanse 40mg  daily as prescribed.   3. Migraine syndrome Refilling Ajovy and Ubrelvy per patient request.  - Fremanezumab-vfrm (AJOVY) 225 MG/1.5ML SOSY; INJECT 225 MG UNDER THE SKIN EVERY 30 DAYS  Dispense: 1.5 mL; Refill: 2 - Ubrogepant (UBRELVY) 50 MG TABS; TAKE 1 TABLET BY MOUTH AS NEEDED FOR MIGRAINE  Dispense: 10 tablet; Refill: 3  4. Generalized anxiety  disorder with panic attacks 5. Major depressive disorder with single episode, in partial remission (HCC) Continue Paxil and Prazosin as prescribed. Strongly recommend contacting Dr. De Nurse for a follow up to discuss her psychiatry medications and potential need for  adjustment.  - PARoxetine (PAXIL) 40 MG tablet; TAKE 1 TABLET(40 MG) BY MOUTH DAILY  Dispense: 90 tablet; Refill: 1 - prazosin (MINIPRESS) 1 MG capsule; TAKE 3 CAPSULES(3 MG) BY MOUTH AT BEDTIME  Dispense: 270 capsule; Refill: 1  6. Gastroesophageal reflux disease, unspecified whether esophagitis present Continue Nexium 40mg  daily.  - esomeprazole (NEXIUM) 40 MG capsule; TAKE ONE CAPSULE BY MOUTH DAILY AT NOON  Dispense: 90 capsule; Refill: 1  7. Labile blood pressure BP unstable with some normal and others elevated. Advised to limit dietary sodium and work on increasing activity with a goal for weight loss.   8. Dysuria Recommend she reach out to her Urologist regarding her prescription for Keflex. These records are not available in our system so I am not privy to their plan.   I discussed the assessment and treatment plan with the patient. The patient was provided an opportunity to ask questions and all were answered. The patient agreed with the plan and demonstrated an understanding of the instructions.   The patient was advised to call back or seek an in-person evaluation if the symptoms worsen or if the condition fails to improve as anticipated.  25 minutes of non-face-to-face time was provided during this encounter.  Return in about 3 months (around 02/08/2022) for ADHD/narcolepsy/BP follow up.  Clearnce Sorrel, DNP, APRN, FNP-BC [facility]

## 2021-11-09 ENCOUNTER — Other Ambulatory Visit: Payer: Self-pay

## 2021-11-09 ENCOUNTER — Telehealth (INDEPENDENT_AMBULATORY_CARE_PROVIDER_SITE_OTHER): Payer: Commercial Managed Care - HMO | Admitting: Medical-Surgical

## 2021-11-09 ENCOUNTER — Encounter: Payer: Self-pay | Admitting: Medical-Surgical

## 2021-11-09 DIAGNOSIS — F324 Major depressive disorder, single episode, in partial remission: Secondary | ICD-10-CM

## 2021-11-09 DIAGNOSIS — R55 Syncope and collapse: Secondary | ICD-10-CM

## 2021-11-09 DIAGNOSIS — K219 Gastro-esophageal reflux disease without esophagitis: Secondary | ICD-10-CM

## 2021-11-09 DIAGNOSIS — F411 Generalized anxiety disorder: Secondary | ICD-10-CM

## 2021-11-09 DIAGNOSIS — G43909 Migraine, unspecified, not intractable, without status migrainosus: Secondary | ICD-10-CM | POA: Diagnosis not present

## 2021-11-09 DIAGNOSIS — F410 Panic disorder [episodic paroxysmal anxiety] without agoraphobia: Secondary | ICD-10-CM

## 2021-11-09 DIAGNOSIS — R0989 Other specified symptoms and signs involving the circulatory and respiratory systems: Secondary | ICD-10-CM

## 2021-11-09 DIAGNOSIS — R300 Dysuria: Secondary | ICD-10-CM

## 2021-11-09 DIAGNOSIS — G47419 Narcolepsy without cataplexy: Secondary | ICD-10-CM

## 2021-11-09 MED ORDER — ESOMEPRAZOLE MAGNESIUM 40 MG PO CPDR: DELAYED_RELEASE_CAPSULE | ORAL | 1 refills | Status: DC

## 2021-11-09 MED ORDER — UBRELVY 50 MG PO TABS: ORAL_TABLET | ORAL | 3 refills | Status: DC

## 2021-11-09 MED ORDER — PREGABALIN 150 MG PO CAPS: 150.0000 mg | ORAL_CAPSULE | Freq: Three times a day (TID) | ORAL | 0 refills | Status: DC

## 2021-11-09 MED ORDER — BLOOD GLUCOSE MONITOR KIT: PACK | 0 refills | Status: DC

## 2021-11-09 MED ORDER — AJOVY 225 MG/1.5ML ~~LOC~~ SOSY: PREFILLED_SYRINGE | SUBCUTANEOUS | 2 refills | Status: DC

## 2021-11-09 MED ORDER — PAROXETINE HCL 40 MG PO TABS: ORAL_TABLET | ORAL | 1 refills | Status: DC

## 2021-11-09 MED ORDER — DICLOFENAC SODIUM 1 % EX GEL
CUTANEOUS | 3 refills | Status: DC
  Filled 2021-11-09: qty 100, 17d supply, fill #0
  Filled 2021-11-17: qty 100, 16d supply, fill #0

## 2021-11-09 MED ORDER — PRAZOSIN HCL 1 MG PO CAPS: ORAL_CAPSULE | ORAL | 1 refills | Status: DC

## 2021-11-09 MED ORDER — LIDOCAINE-PRILOCAINE 2.5-2.5 % EX CREA
TOPICAL_CREAM | Freq: Three times a day (TID) | CUTANEOUS | 3 refills | Status: DC | PRN
  Filled 2021-11-09: qty 30, fill #0

## 2021-11-09 MED ORDER — 1ST MEDX-PATCH/ LIDOCAINE 4-0.025-5-20 % EX PTCH
MEDICATED_PATCH | CUTANEOUS | 3 refills | Status: DC
  Filled 2021-11-09 – 2021-11-17 (×2): qty 10, fill #0

## 2021-11-10 ENCOUNTER — Other Ambulatory Visit: Payer: Self-pay

## 2021-11-10 MED ORDER — BLOOD GLUCOSE MONITOR KIT: PACK | 0 refills | Status: AC

## 2021-11-13 ENCOUNTER — Encounter: Payer: Self-pay | Admitting: Medical-Surgical

## 2021-11-13 ENCOUNTER — Telehealth: Payer: Medicaid Other | Admitting: Medical-Surgical

## 2021-11-16 NOTE — Therapy (Addendum)
OUTPATIENT PHYSICAL THERAPY LUMBAR/SACRAL EVALUATION AND DISCHARGE SUMMARY   Patient Name: Zamariya Jacque Byron MRN: [id number] DOB:April 04, 1985, 36 y.o., female Today's Date: 11/17/2021   PT End of Session - 11/17/21 0853     Visit Number 1    Number of Visits 16    Date for PT Re-Evaluation 01/12/22    Authorization Type [employer]ann[employer] and [employer]    PT Start Time [phone]    PT Stop Time 0940    PT Time Calculation (min) 47 min    Activity Tolerance Patient tolerated treatment well    Behavior During Therapy [facility] for tasks assessed/performed             Past Medical History:  Diagnosis Date   Allergy    Imatrex and plastic tape   Arthritis    In knees, back, wrist, neck   Asthma    At age 40   Chronic pain    Clotting disorder ([place])    Rectal bleeding at times, hemroids, ulcer   Depression    At age 34 but worse after my son died in Apr 17, 2009   Fibromyalgia    Generalized anxiety disorder    GERD (gastroesophageal reflux disease)    Hypertension    About 4 years ago   Migraine syndrome    Osteoporosis    I tend to fracture bones easily and doctor wanted a bone density test in 17-Apr-2010 but found out I was pregnant and was never followed up with   PTSD (post-traumatic stress disorder)    Scoliosis    Stroke ([place])    Body reacted as a stroke but not an actual stroke. 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 Lost feeling and control on right side   Ulcer    Past Surgical History:  Procedure Laterality Date   CESAREAN SECTION     CHOLECYSTECTOMY     TUBAL LIGATION     Patient Active Problem List   Diagnosis Date Noted   Abnormal laboratory test 07/20/2021   Weakness 07/14/2021   Gait abnormality 07/14/2021   Chronic migraine w/o aura w/o status migrainosus, not intractable 07/14/2021   Morbid obesity ([place]) 07/14/2021   Sinus tachycardia 07/09/2021   Hospital discharge follow-up 07/09/2021   Chronic left hip pain 06/17/2021   DDD (degenerative disc disease),  cervical 04/21/2021   Tobacco abuse 04/06/2021   Migraine syndrome 03/16/2021   Asthma 03/08/2021   Chronic migraine without aura without status migrainosus, not intractable 05/26/2017   Major depressive disorder 08/06/2016   Panic disorder 03/13/2015   Narcolepsy 03/13/2015   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   Chronic post-traumatic stress disorder (PTSD) 12/16/2009   Fibromyalgia affecting multiple sites 10/31/2006   OCD (obsessive compulsive disorder) 04/10/2006   Generalized anxiety disorder with panic attacks 04/10/2006    PCP: Samuel Bouche, NP  REFERRING PROVIDER: Lulu Riding MD / Manus Rudd, NP  REFERRING DIAG: [phone] Other spondylosis, sacral and sacrococcygeal region  Rationale for Evaluation and Treatment Rehabilitation  THERAPY DIAG:  Sacrococcygeal disorders, not elsewhere classified  Other low back pain  Muscle weakness (generalized)  Cramp and spasm  Unsteadiness on feet  ONSET DATE: 10-15 years  SUBJECTIVE:  SUBJECTIVE STATEMENT: Patient fell on her back when she was a teenager and then had compression fx of L1 and L2 in 2012 due to a fall. She has had PT, injections and other treatments. She has constant pain in bil SIJ L> R. Pain stays in gluteal areas, not down the leg. Reports Left leg is higher due to scoliosis. She can stand 10 min comfortably, Walking is the hardest thing and she ends up off balance. Uses SPC x 6 years. She can sit about 20 min. Lying down is the most comfortable but still has to shift.  Also c/o left shoulder pain x 5 years.  Accompanied by spouse: Mary  PERTINENT HISTORY:  Anxiety/depression, fibromyalgia, scoliosis, PTSD, chronic pain, bil knee pain, left shoulder pain, chronic neck pain, hypoglycemia, spinal fluid on pituitary gland,  migraines, conversion spell due to stress, h/o falls, tacchycardia/arythmia getting assessed due to syncope  PAIN:  Are you having pain? Yes: NPRS scale: 7/10 Pain location: bil SIJ L> R Pain description: constant, sharp in left hip Aggravating factors: everything Relieving factors: lying down  PAIN:  Are you having pain? Yes: NPRS scale: 5/10 Pain location: left Pain description: dull achy but when use it is sharp Aggravating factors: use Relieving factors: rest      PRECAUTIONS: Other: osteporosis, clotting disorder   WEIGHT BEARING RESTRICTIONS No  FALLS:  Has patient fallen in last 6 months? Yes. Number of falls 3; due to syncope - getting checked out at [facility]: Lives with: lives with their spouse Lives in: House/apartment Stairs: No Has following equipment at home: Single point cane  OCCUPATION: unemployed at present, filed for disability  PLOF: Independent with community mobility with device  PATIENT GOALS to feel better, regain function   OBJECTIVE:   DIAGNOSTIC FINDINGS:  MRI May 2023 left hip: No evidence of labral tear or other explanation for left hip pain. Both femoral heads appear normal.  PATIENT SURVEYS:  LEFS 9/80 = 11.3% disability  SCREENING FOR RED FLAGS: Bowel or bladder incontinence: No Spinal tumors: No Cauda equina syndrome: No Compression fracture: Yes: healed L1 and L2 Abdominal aneurysm: No  COGNITION:  Overall cognitive status: Within functional limits for tasks assessed     SENSATION: Reports neuropathy in bil hands and feet  MUSCLE LENGTH: VZ:SMOL tighness end range Quads:mild ITB: NT Piriformis:WNL Hip Flexors: mild tight left hip flexor Heelcords: WNL     POSTURE: increased lumbar lordosis  PALPATION: Bil gluteals, along SIJ most tender at PSIS, increased tone in right gluteals. TTP in lumbar spine and did not tolerate gentle mobs.  LUMBAR ROM:   Active  A/PROM  eval  Flexion full   Extension 25% and painful  Right lateral flexion Mid shin; pain and jumpy  Left lateral flexion Mid shin; pain and jumpy  Right rotation 90% pain and jumpy  Left rotation 80% pain and jumpy   (Blank rows = not tested)  LOWER EXTREMITY ROM:       Right eval Left eval  Hip flexion WNL WNL  Hip extension    Hip abduction WNL WNL  Hip adduction WNL WNL  Hip internal rotation WNL WNL  Hip external rotation WNL WNL  Knee flexion WNL WNL  Knee extension WNL WNL  Ankle dorsiflexion WNL WNL   (Blank rows = not tested)  LOWER EXTREMITY MMT:    MMT Right eval Left eval  Hip flexion 4- 5  Hip extension 5 5  Hip abduction 5 5  Hip adduction  5 5  Hip internal rotation 5 5  Hip external rotation 4+ 5  Knee flexion 4 jumpy 5  Knee extension 4+ 5  Ankle dorsiflexion 4- 5   (Blank rows = not tested)  LUMBAR SPECIAL TESTS:  FABER test: neg and SIJ compression feels pain on right SIJ, distraction on left  Relief in prone over one pillow position (pain down to 2/10)  FUNCTIONAL TESTS: TBA next visit 5 times sit to stand:   Timed up and go (TUG):    GAIT: Distance walked: 30 Assistive device utilized: Single point cane Level of assistance: Modified independence Comments: antalgic gait, decreased step length, forward and right trunk lean.    TODAY'S TREATMENT  See HEP   PATIENT EDUCATION:  Education details: PT eval findings, anticipated POC, and need for further assessment of balance   Person educated: Patient Education method: Explanation, Demonstration, Verbal cues, and Handouts Education comprehension: verbalized understanding and returned demonstration   HOME EXERCISE PROGRAM: Access Code: [email or web address] URL: [email or web address] Date: 11/17/2021 Prepared by: Almyra Free  Exercises - Doorway Piriformis Stretch  - 2 x daily - 7 x weekly - 1 sets - 3 reps - 60 sec hold - Supine Butterfly Groin Stretch  - 2 x daily - 7 x weekly - 1 sets - 3 reps - 60 sec  hold - Standard Plank  - 2 x daily - 7 x weekly - 1 sets - 5 reps - max hold - Prone Hip Extension - One Pillow  - 5-6 x daily - 7 x weekly - 1 sets - 1 reps - 5 min hold  ASSESSMENT:  CLINICAL IMPRESSION: Paulene Michelle Coberly "Gerald Stabs" is a 36 y.o. female who was seen today for physical therapy evaluation and treatment for chronic SIJ pain. She has a long h/o of chronic pain throughout her body and many medical treatments have failed to give relief. She presents with painful lumbar and hip ROM limiting standing, walking and sitting and affecting her ability to work. She has considerable core weakness and right LE weakness affecting her balance. Recent episodes of syncope and falls are being addressed at [facility]. She presently amb with a SPC and has for 6 years. She has limitations in lumbar ROM, hip and core strength, and mild flexibillty deficits. Balance will be assessed next visit and goals set. Due to patient's deficits and her high pain level, she would likely benefit from aquatic therapy in addition to land based PT. Gerald Stabs has potential for improvement and would benefit from d skilled therapy to address impairments.       OBJECTIVE IMPAIRMENTS Abnormal gait, decreased activity tolerance, decreased balance, decreased ROM, decreased strength, increased muscle spasms, impaired flexibility, impaired sensation, impaired UE functional use, postural dysfunction, obesity, and pain.   ACTIVITY LIMITATIONS sitting, standing, squatting, stairs, and locomotion level  PARTICIPATION LIMITATIONS: cleaning, driving, and occupation  PERSONAL FACTORS Time since onset of injury/illness/exacerbation, Transportation, and 3+ comorbidities: Anxiety/depression, fibromyalgia, scoliosis, PTSD, chronic pain, bil knee pain, left shoulder pain, chronic neck pain, hypoglycemia, spinal fluid on pituitary gland, migraines, conversion spell due to stress, h/o falls, tacchycardia/arythmia getting assessed due to  syncope  are also affecting patient's functional outcome.   REHAB POTENTIAL: Good  CLINICAL DECISION MAKING: Stable/uncomplicated  EVALUATION COMPLEXITY: Low   GOALS: Goals reviewed with patient? Yes  SHORT TERM GOALS: Target date: 12/07/2021 (Remove Blue Hyperlink)  Patient will be independent with initial HEP.  Baseline: no HEP Goal status: INITIAL  2.  Balance to be assessed and goals  set on 2nd visit. Baseline: TBA 2nd visit Goal status: INITIAL   LONG TERM GOALS: Target date: 01/18/2022  (Remove Blue Hyperlink)  Patient will be independent with advanced/ongoing HEP to improve outcomes and carryover.  Baseline: initial HEP Goal status: INITIAL  2.  Patient will report 50% improvement in SIJ pain to improve QOL.  Baseline: 7/10 pain Goal status: INITIAL  3.  Patient will demonstrate smooth functional lumbar ROM to perform ADLs.   Baseline: limitations in ext mainly and poor quality of movement in all planes causing LOB Goal status: INITIAL  4.  Patient will demonstrate improved functional strength and decrease likelihood of falls.as demonstrated by improved 5xSTS time to < 14 sec.  Baseline: to be set 2nd visit Goal status: INITIAL  5.  Patient will report >=18 on LEFS to demonstrate improved functional ability.  Baseline: 9/80 = 11.3% disability Goal status: INITIAL   6.  Patient will tolerate 25% improved tolerance to standing/sitting/walking. Baseline: 10 min/62mn/<10 min Goal status: INITIAL  7.  Patient will demonstrate decreased risk for falls as evidenced by improved TUG to < 25 seconds Baseline: to be determined 2nd visit Goal status: INITIAL   PLAN: PT FREQUENCY: 2x/week  PT DURATION: 8 weeks  PLANNED INTERVENTIONS: Therapeutic exercises, Therapeutic activity, Neuromuscular re-education, Balance training, Gait training, Patient/Family education, Self Care, Joint mobilization, Aquatic Therapy, Dry Needling, Electrical stimulation, Spinal  mobilization, Cryotherapy, Moist heat, Taping, Ionotophoresis 426mml Dexamethasone, Manual therapy, Re-evaluation, and manual traction .  PLAN FOR NEXT SESSION: Review and progress HEP, Test TUG and 5xSTS and modify goals 4 and 7 as needed, focus on core strength, stabilization, R LE strength and balance.      Kyree Fedorko, PT 11/17/2021, 10:19 AM  PHYSICAL THERAPY DISCHARGE SUMMARY  Visits from Start of Care: 1  Current functional level related to goals / functional outcomes: Unknown   Remaining deficits: Unknown   Education / Equipment: HEP   Patient agrees to discharge. Patient goals were not met. Patient is being discharged due to not returning since the last visit.  JuMadelyn FlavorsPT 12/24/21 12:22 PM

## 2021-11-17 ENCOUNTER — Encounter: Payer: Self-pay | Admitting: Physical Therapy

## 2021-11-17 ENCOUNTER — Encounter (INDEPENDENT_AMBULATORY_CARE_PROVIDER_SITE_OTHER): Payer: Self-pay

## 2021-11-17 ENCOUNTER — Other Ambulatory Visit: Payer: Self-pay | Admitting: Medical-Surgical

## 2021-11-17 ENCOUNTER — Ambulatory Visit: Payer: Managed Care, Other (non HMO) | Attending: Pediatrics | Admitting: Physical Therapy

## 2021-11-17 ENCOUNTER — Other Ambulatory Visit: Payer: Self-pay

## 2021-11-17 ENCOUNTER — Other Ambulatory Visit (HOSPITAL_COMMUNITY): Payer: Self-pay

## 2021-11-17 DIAGNOSIS — R2681 Unsteadiness on feet: Secondary | ICD-10-CM | POA: Diagnosis present

## 2021-11-17 DIAGNOSIS — M5459 Other low back pain: Secondary | ICD-10-CM | POA: Diagnosis present

## 2021-11-17 DIAGNOSIS — R252 Cramp and spasm: Secondary | ICD-10-CM | POA: Diagnosis present

## 2021-11-17 DIAGNOSIS — M533 Sacrococcygeal disorders, not elsewhere classified: Secondary | ICD-10-CM | POA: Insufficient documentation

## 2021-11-17 DIAGNOSIS — M6281 Muscle weakness (generalized): Secondary | ICD-10-CM | POA: Diagnosis present

## 2021-11-18 ENCOUNTER — Other Ambulatory Visit (HOSPITAL_COMMUNITY): Payer: Self-pay

## 2021-11-19 ENCOUNTER — Ambulatory Visit: Payer: Medicaid Other | Admitting: Physical Therapy

## 2021-11-23 ENCOUNTER — Ambulatory Visit (HOSPITAL_COMMUNITY): Payer: 59 | Admitting: Licensed Clinical Social Worker

## 2021-11-23 ENCOUNTER — Encounter (HOSPITAL_COMMUNITY): Payer: Self-pay

## 2021-11-23 ENCOUNTER — Ambulatory Visit: Payer: Managed Care, Other (non HMO)

## 2021-11-23 DIAGNOSIS — F331 Major depressive disorder, recurrent, moderate: Secondary | ICD-10-CM

## 2021-11-23 DIAGNOSIS — F411 Generalized anxiety disorder: Secondary | ICD-10-CM

## 2021-11-23 DIAGNOSIS — F4310 Post-traumatic stress disorder, unspecified: Secondary | ICD-10-CM

## 2021-11-23 NOTE — Progress Notes (Unsigned)
Cardiology Office Note:    Date:  11/23/2021   ID:  Beth Lane, DOB 05-20-1985, MRN [id number]  PCP:  Beth Butter, NP   [facility] Providers Cardiologist:  Alverda Skeans, MD Referring MD: Beth Butter, NP   Chief Complaint/Reason for Referral: Syncope  ASSESSMENT:    1. Syncope and collapse     PLAN:    In order of problems listed above: 1.  Syncope: We will obtain monitor and echocardiogram.  Given MRI evidence of empty sella turcica I think she requires an endocrinology evaluation for adrenal insufficiency which can cause presyncope and syncope.  I will keep follow-up with me open-ended.         {Are you ordering a CV Procedure (e.g. stress test, cath, DCCV, TEE, etc)?   Press F2        :[id number]}   Dispo:  No follow-ups on file.      Medication Adjustments/Labs and Tests Ordered: Current medicines are reviewed at length with the patient today.  Concerns regarding medicines are outlined above.  The following changes have been made:  {PLAN; NO CHANGE:13088:s}   Labs/tests ordered: No orders of the defined types were placed in this encounter.   Medication Changes: No orders of the defined types were placed in this encounter.    Current medicines are reviewed at length with the patient today.  The patient {ACTIONS; HAS/DOES NOT HAVE:19233} concerns regarding medicines.   History of Present Illness:    FOCUSED PROBLEM LIST:   1.  Chronic migraines 2.  Fibromyalgia 3.  Partially empty sella turcica  The patient is a 36 y.o. female with the indicated medical history here for recommendations regarding multiple syncopal episodes.         Current Medications: No outpatient medications have been marked as taking for the 11/25/21 encounter (Office Visit) with Orbie Pyo, MD.     Allergies:    Sumatriptan, Barium sulfate, and Tape   Social History:   Social History   Tobacco Use   Smoking status: Every Day    Types:  E-cigarettes   Smokeless tobacco: Never  Vaping Use   Vaping Use: Every day  Substance Use Topics   Alcohol use: Never   Drug use: Never     Family Hx: Family History  Problem Relation Age of Onset   Hypertension Mother    Cancer Mother    Bipolar disorder Mother    Fibromyalgia Mother    ADD / ADHD Mother    Anxiety disorder Mother    Arthritis Mother    COPD Mother    Depression Mother    Diabetes Mother    Obesity Mother    Stroke Father    Post-traumatic stress disorder Father    Heart attack Father    ADD / ADHD Father    Anxiety disorder Father    Asthma Father    Alcohol abuse Maternal Grandfather    ADD / ADHD Daughter    ADD / ADHD Son    Anxiety disorder Son    ADD / ADHD Brother    ADD / ADHD Sister    Anxiety disorder Son    Intellectual disability Son    Anxiety disorder Son    Hearing loss Son    Learning disabilities Son    Early death Son    Learning disabilities Son    Miscarriages / [place] Sister      Review of Systems:   Please see the history of present illness.  All other systems reviewed and are negative.     EKGs/Labs/Other Test Reviewed:    EKG:  EKG performed May 2023 that I personally reviewed demonstrates sinus tachycardia; EKG performed today that I personally reviewed demonstrates ***.  Prior CV studies: None available  Other studies Reviewed: Review of the additional studies/records demonstrates: Brain MRI 2023 which demonstrates partially empty sella turcica; renal CT scan demonstrates no evidence of aortic atherosclerosis  Recent Labs: 06/01/2021: ALT 19; Magnesium 2.0 07/06/2021: BUN 13; Creatinine, Ser 0.63; Hemoglobin 13.4; Platelets 264; Potassium 3.7; Sodium 138 07/14/2021: TSH 2.170   Recent Lipid Panel No results found for: "CHOL", "TRIG", "HDL", "LDLCALC", "LDLDIRECT"  Risk Assessment/Calculations:    {Does this patient have ATRIAL FIBRILLATION?:[phone]}      No BP recorded.  {Refresh Note OR  Click here to enter BP  :1}***    Physical Exam:    VS:  There were no vitals taken for this visit.   Wt Readings from Last 3 Encounters:  10/14/21 248 lb 4 oz (112.6 kg)  07/14/21 252 lb (114.3 kg)  06/05/21 242 lb (109.8 kg)    GENERAL:  No apparent distress, AOx3 HEENT:  No carotid bruits, +2 carotid impulses, no scleral icterus CAR: RRR Irregular RR*** no murmurs***, gallops, rubs, or thrills RES:  Clear to auscultation bilaterally ABD:  Soft, nontender, nondistended, positive bowel sounds x 4 VASC:  +2 radial pulses, +2 carotid pulses, palpable pedal pulses NEURO:  CN 2-12 grossly intact; motor and sensory grossly intact PSYCH:  No active depression or anxiety EXT:  No edema, ecchymosis, or cyanosis  Signed, Early Osmond, MD  11/23/2021 10:59 AM    [address], [place], [place]  [address] Phone: [phone]; Fax: [phone]   Note:  This document was prepared using Dragon voice recognition software and may include unintentional dictation errors.

## 2021-11-23 NOTE — Progress Notes (Signed)
Therapist sent text to patient for session but she did not respond. She called later to office to check about appointment but session a no show

## 2021-11-25 ENCOUNTER — Ambulatory Visit: Payer: Managed Care, Other (non HMO) | Attending: Internal Medicine

## 2021-11-25 ENCOUNTER — Encounter: Payer: Self-pay | Admitting: Physical Therapy

## 2021-11-25 ENCOUNTER — Encounter: Payer: Self-pay | Admitting: Internal Medicine

## 2021-11-25 ENCOUNTER — Ambulatory Visit (HOSPITAL_BASED_OUTPATIENT_CLINIC_OR_DEPARTMENT_OTHER): Payer: Managed Care, Other (non HMO) | Attending: Internal Medicine | Admitting: Internal Medicine

## 2021-11-25 ENCOUNTER — Ambulatory Visit: Payer: Managed Care, Other (non HMO) | Admitting: Physical Therapy

## 2021-11-25 VITALS — BP 100/70 | HR 94 | Ht 65.0 in | Wt 248.0 lb

## 2021-11-25 DIAGNOSIS — R55 Syncope and collapse: Secondary | ICD-10-CM | POA: Diagnosis not present

## 2021-11-25 DIAGNOSIS — R002 Palpitations: Secondary | ICD-10-CM | POA: Diagnosis not present

## 2021-11-25 DIAGNOSIS — E236 Other disorders of pituitary gland: Secondary | ICD-10-CM

## 2021-11-25 DIAGNOSIS — R072 Precordial pain: Secondary | ICD-10-CM | POA: Diagnosis not present

## 2021-11-25 MED ORDER — METOPROLOL TARTRATE 100 MG PO TABS: 100.0000 mg | ORAL_TABLET | Freq: Once | ORAL | 0 refills | Status: DC

## 2021-11-25 NOTE — Patient Instructions (Signed)
Medication Instructions:  Your physician recommends that you continue on your current medications as directed. Please refer to the Current Medication list given to you today.  *If you need a refill on your cardiac medications before your next appointment, please call your pharmacy*   Lab Work: BMET If you have labs (blood work) drawn today and your tests are completely normal, you will receive your results only by: [place] (if you have MyChart) OR A paper copy in the mail If you have any lab test that is abnormal or we need to change your treatment, we will call you to review the results.   Testing/Procedures: Your physician has recommended that you wear an event monitor. Event monitors are medical devices that record the heart's electrical activity. Doctors most often [place] these monitors to diagnose arrhythmias. Arrhythmias are problems with the speed or rhythm of the heartbeat. The monitor is a small, portable device. You can wear one while you do your normal daily activities. This is usually used to diagnose what is causing palpitations/syncope (passing out).   Your physician has requested that you have an echocardiogram. Echocardiography is a painless test that uses sound waves to create images of your heart. It provides your doctor with information about the size and shape of your heart and how well your heart's chambers and valves are working. This procedure takes approximately one hour. There are no restrictions for this procedure.   Follow-Up: At [facility], you and your health needs are our priority.  As part of our continuing mission to provide you with exceptional heart care, we have created designated Provider Care Teams.  These Care Teams include your primary Cardiologist (physician) and Advanced Practice Providers (APPs -  Physician Assistants and Nurse Practitioners) who all work together to provide you with the care you need, when you need it.  Your next  appointment:   As needed   The format for your next appointment:   In Person  Provider:    Other Instructions   Your cardiac CT will be scheduled at one of the below locations:   [facility] [address] [place], [place] [address] [phone]  [facility] [address] [place], [place] [address] [phone]   Please arrive at the [facility] [place] (Entrance C2) of [facility] 30 minutes prior to test start time. You can use the FREE valet parking offered at entrance C (encouraged to control the heart rate for the test)  Proceed to the [facility] Radiology Department (first floor) to check-in and test prep.  All radiology patients and guests should use entrance C2 at [facility], accessed from [facility], even though the hospital's physical address listed is [address]      Please follow these instructions carefully (unless otherwise directed):   On the Night Before the Test: Be sure to Drink plenty of water. Do not consume any caffeinated/decaffeinated beverages or chocolate 12 hours prior to your test. Do not take any antihistamines 12 hours prior to your test.   On the Day of the Test: Drink plenty of water until 1 hour prior to the test. Do not eat any food 1 hour prior to test. You may take your regular medications prior to the test.  Take metoprolol (Lopressor) two hours prior to test. HOLD Furosemide/Hydrochlorothiazide morning of the test. FEMALES- please wear underwire-free bra if available, avoid dresses & tight clothing  After the Test: Drink plenty of water. After receiving IV contrast, you may experience a mild flushed feeling. This is normal. On occasion, you may experience a mild rash up to 24 hours after the test. This is not dangerous. If this occurs, you can take Benadryl 25 mg and increase your fluid intake. If you  experience trouble breathing, this can be serious. If it is severe call 911 IMMEDIATELY. If it is mild, please call our office. If you take any of these medications: Glipizide/Metformin, Avandament, Glucavance, please do not take 48 hours after completing test unless otherwise instructed.  We will call to schedule your test 2-4 weeks out understanding that some insurance companies will need an authorization prior to the service being performed.   For non-scheduling related questions, please contact the cardiac imaging nurse navigator should you have any questions/concerns: Marchia Bond, Cardiac Imaging Nurse Navigator Gordy Clement, Cardiac Imaging Nurse Navigator [facility] Direct Office Dial: [phone]   For scheduling needs, including cancellations and rescheduling, please call [place], [phone].    ZIO AT Long term monitor-Live Telemetry  Your physician has requested you wear a ZIO patch monitor for 7 days.  This is a single patch monitor. [employer] supplies one patch monitor per enrollment. Additional  stickers are not available.  Please do not apply patch if you will be having a Nuclear Stress Test, Echocardiogram, Cardiac CT, MRI,  or Chest Xray during the period you would be wearing the monitor. The patch cannot be worn during  these tests. You cannot remove and re-apply the ZIO AT patch monitor.  Your ZIO patch monitor will be mailed 3 day USPS to your address on file. It may take 3-5 days to  receive your monitor after you have been enrolled.  Once you have received your monitor, please review the enclosed instructions. Your monitor has  already been registered assigning a specific monitor serial # to you.   Billing and Patient Assistance Program information  Theodore Demark has been supplied with any insurance information on record for billing. [employer] offers a sliding scale Patient Assistance Program for patients without insurance, or whose   insurance does not completely cover the cost of the ZIO patch monitor. You must apply for the  Patient Assistance Program to qualify for the discounted rate. To apply, call [employer] at [phone],  select option 4, select option 2 , ask to apply for the Patient Assistance Program, (you can request an  interpreter if needed). [employer] will ask your household income and how many people are in your  household. [employer] will quote your out-of-pocket cost based on this information. They will also be able  to set up a 12 month interest free payment plan if needed.  Applying the monitor   Shave hair from upper left chest.  Hold the abrader disc by orange tab. Rub the abrader in 40 strokes over left upper chest as indicated in  your monitor instructions.  Clean area with 4 enclosed alcohol pads. Use all pads to ensure the area is cleaned thoroughly. Let  dry.  Apply patch as indicated in monitor instructions. Patch will be placed under collarbone on left side of  chest with arrow pointing upward.  Rub patch adhesive wings for 2 minutes. Remove the white label marked "1". Remove the white label  marked "2". Rub patch adhesive wings for 2 additional minutes.  While looking in a mirror, press and release button in center of patch. A small green light will flash  3-4  times. This will be your only indicator that the monitor has been turned on.  Do not shower for the first 24 hours. You may shower after the first 24 hours.  Press the button if you feel a symptom. You will hear a small click. Record Date, Time and Symptom in  the Patient Log.   Starting the Gateway  In your kit there is a Hydrographic surveyor box the size of a cellphone. This is Airline pilot. It transmits all your  recorded data to [facility]. This box must always stay within 10 feet of you. Open the box and push the *  button. There will be a light that blinks orange and then green a few times. When the light stops  blinking, the Gateway is  connected to the ZIO patch. Call [employer] at [phone] to confirm your monitor is transmitting.  Returning your monitor  Remove your patch and place it inside the [place]. In the lower half of the Gateway there is a white  bag with prepaid postage on it. Place Gateway in bag and seal. Mail package back to [place] as soon as  possible. Your physician should have your final report approximately 7 days after you have mailed back  your monitor. Call [place] at [phone] if you have questions regarding your ZIO AT  patch monitor. Call them immediately if you see an orange light blinking on your monitor.  If your monitor falls off in less than 4 days, contact our Monitor department at [phone]. If your  monitor becomes loose or falls off after 4 days call [employer] at [phone] for suggestions on  securing your monitor

## 2021-11-25 NOTE — Progress Notes (Unsigned)
Enrolled for [employer] to mail a ZIO XT long term holter monitor to the patients address on file.  

## 2021-11-26 LAB — BASIC METABOLIC PANEL
BUN/Creatinine Ratio: 19 (ref 9–23)
BUN: 15 mg/dL (ref 6–20)
CO2: 22 mmol/L (ref 20–29)
Calcium: 9.2 mg/dL (ref 8.7–10.2)
Chloride: 103 mmol/L (ref 96–106)
Creatinine, Ser: 0.8 mg/dL (ref 0.57–1.00)
Glucose: 88 mg/dL (ref 70–99)
Potassium: 4.1 mmol/L (ref 3.5–5.2)
Sodium: 139 mmol/L (ref 134–144)
eGFR: 99 mL/min/{1.73_m2} (ref >59)

## 2021-11-27 DIAGNOSIS — R072 Precordial pain: Secondary | ICD-10-CM | POA: Diagnosis not present

## 2021-11-27 DIAGNOSIS — R002 Palpitations: Secondary | ICD-10-CM | POA: Diagnosis not present

## 2021-11-27 DIAGNOSIS — R55 Syncope and collapse: Secondary | ICD-10-CM | POA: Diagnosis not present

## 2021-12-02 ENCOUNTER — Ambulatory Visit: Payer: Managed Care, Other (non HMO)

## 2021-12-03 ENCOUNTER — Encounter: Payer: Medicaid Other | Admitting: Physical Therapy

## 2021-12-08 ENCOUNTER — Ambulatory Visit: Payer: Medicaid Other | Admitting: Medical-Surgical

## 2021-12-08 ENCOUNTER — Telehealth (HOSPITAL_COMMUNITY): Payer: Self-pay | Admitting: Emergency Medicine

## 2021-12-08 ENCOUNTER — Encounter: Payer: Medicaid Other | Admitting: Physical Therapy

## 2021-12-08 DIAGNOSIS — R0789 Other chest pain: Secondary | ICD-10-CM

## 2021-12-08 MED ORDER — METOPROLOL TARTRATE 100 MG PO TABS: 100.0000 mg | ORAL_TABLET | Freq: Once | ORAL | 0 refills | Status: DC

## 2021-12-08 MED ORDER — IVABRADINE HCL 5 MG PO TABS: 7.5000 mg | ORAL_TABLET | Freq: Once | ORAL | 0 refills | Status: AC

## 2021-12-08 NOTE — Telephone Encounter (Signed)
Pt calling to report ivabradine is too expensive and cannot afford it. Pts BP runs low, told her to take 50mg  metoprolol tartrate.   Marchia Bond RN Navigator Cardiac Imaging [facility] [phone] Office  [phone] Cell

## 2021-12-08 NOTE — Telephone Encounter (Signed)
Reaching out to patient to offer assistance regarding upcoming cardiac imaging study; pt verbalizes understanding of appt date/time, parking situation and where to check in, pre-test NPO status and medications ordered, and verified current allergies; name and call back number provided for further questions should they arise Marchia Bond RN [place] [facility] [phone] office [phone] cell  Pt states BP fluctuates so she checked it while on phone was 108/50. Decided to prescribe 15mg  ivabradine for HR control for scan tomorrow. (Hold metop). She said shes on a fixed income so may or may not be able to afford med. Asked her to call me back so we can restrategize.  Arrival [address]

## 2021-12-09 ENCOUNTER — Encounter: Payer: Medicaid Other | Admitting: Physical Therapy

## 2021-12-09 ENCOUNTER — Ambulatory Visit (HOSPITAL_COMMUNITY)
Admission: RE | Admit: 2021-12-09 | Discharge: 2021-12-09 | Disposition: A | Discharge status: Home / Self Care | Payer: Commercial Managed Care - HMO | Source: Ambulatory Visit | Attending: Internal Medicine | Admitting: Internal Medicine

## 2021-12-09 DIAGNOSIS — R072 Precordial pain: Secondary | ICD-10-CM | POA: Diagnosis not present

## 2021-12-09 MED ORDER — METOPROLOL TARTRATE 5 MG/5ML IV SOLN
INTRAVENOUS | Status: AC
  Filled 2021-12-09: qty 5

## 2021-12-09 MED ORDER — NITROGLYCERIN 0.4 MG SL SUBL
0.8000 mg | SUBLINGUAL_TABLET | Freq: Once | SUBLINGUAL | Status: AC
  Administered 2021-12-09: 0.8 mg via SUBLINGUAL

## 2021-12-09 MED ORDER — IOHEXOL 350 MG/ML SOLN
95.0000 mL | Freq: Once | INTRAVENOUS | Status: AC | PRN
  Administered 2021-12-09: 95 mL via INTRAVENOUS

## 2021-12-09 MED ORDER — NITROGLYCERIN 0.4 MG SL SUBL
SUBLINGUAL_TABLET | SUBLINGUAL | Status: AC
  Filled 2021-12-09: qty 2

## 2021-12-09 MED ORDER — METOPROLOL TARTRATE 5 MG/5ML IV SOLN: 5.0000 mg | INTRAVENOUS | Status: DC | PRN

## 2021-12-10 ENCOUNTER — Ambulatory Visit: Payer: Managed Care, Other (non HMO)

## 2021-12-13 NOTE — Therapy (Incomplete)
OUTPATIENT PHYSICAL THERAPY LUMBAR/SACRAL EVALUATION   Patient Name: Beth Lane MRN: [id number] DOB:Mar 29, 1985, 36 y.o., female Today's Date: 12/13/2021     Past Medical History:  Diagnosis Date   Allergy    Imatrex and plastic tape   Arthritis    In knees, back, wrist, neck   Asthma    At age 63   Chronic pain    Clotting disorder (HCC)    Rectal bleeding at times, hemroids, ulcer   Depression    At age 48 but worse after my son died in 2009-06-27   Fibromyalgia    Generalized anxiety disorder    GERD (gastroesophageal reflux disease)    Hypertension    About 4 years ago   Migraine syndrome    Osteoporosis    I tend to fracture bones easily and doctor wanted a bone density test in 06/28/10 but found out I was pregnant and was never followed up with   PTSD (post-traumatic stress disorder)    Scoliosis    Stroke (HCC)    Body reacted as a stroke but not an actual stroke. 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 Lost feeling and control on right side   Ulcer    Past Surgical History:  Procedure Laterality Date   CESAREAN SECTION     CHOLECYSTECTOMY     TUBAL LIGATION     Patient Active Problem List   Diagnosis Date Noted   Abnormal laboratory test 07/20/2021   Weakness 07/14/2021   Gait abnormality 07/14/2021   Chronic migraine w/o aura w/o status migrainosus, not intractable 07/14/2021   Morbid obesity (HCC) 07/14/2021   Sinus tachycardia 07/09/2021   Hospital discharge follow-up 07/09/2021   Chronic left hip pain 06/17/2021   DDD (degenerative disc disease), cervical 04/21/2021   Tobacco abuse 04/06/2021   Migraine syndrome 03/16/2021   Asthma 03/08/2021   Chronic migraine without aura without status migrainosus, not intractable 05/26/2017   Major depressive disorder 08/06/2016   Panic disorder 03/13/2015   Narcolepsy 03/13/2015   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  ONSET DATE: 10-15 years  SUBJECTIVE:  SUBJECTIVE STATEMENT: Patient fell on her back when she was a teenager and then had compression fx of L1 and L2 in 2012 due to a fall. She has had PT, injections and other treatments. She has constant pain in bil SIJ L> R. Pain stays in gluteal areas, not down the leg. Reports Left leg is higher due to scoliosis. She can stand 10 min comfortably, Walking is the hardest thing and she ends up off balance. Uses SPC x 6 years. She can sit about 20 min. Lying down is the most comfortable but still has to shift.  Also c/o left shoulder pain x 5 years.  Accompanied by spouse: Beth Lane  PERTINENT HISTORY:  Anxiety/depression, fibromyalgia, scoliosis, PTSD, chronic pain, bil knee pain, left shoulder pain, chronic neck pain, hypoglycemia, spinal fluid on pituitary gland, migraines, conversion spell due to stress, h/o falls, tacchycardia/arythmia getting assessed due to syncope  PAIN:  Are you having pain? Yes: NPRS scale: 7/10 Pain location: bil SIJ L> R Pain description: constant, sharp in left hip Aggravating factors: everything Relieving factors: lying down  PAIN:  Are you having pain? Yes: NPRS scale: 5/10 Pain location: left Pain description: dull achy but when use it is sharp Aggravating factors: use Relieving factors: rest      PRECAUTIONS: Other: osteporosis,  clotting disorder   WEIGHT BEARING RESTRICTIONS No  FALLS:  Has patient fallen in last 6 months? Yes. Number of falls 3; due to syncope - getting checked out at [facility]  LIVING ENVIRONMENT: Lives with: lives with their spouse Lives in: House/apartment Stairs: No Has following equipment at home: Single point cane  OCCUPATION: unemployed at present, filed for disability  PLOF: Independent with community mobility with device  PATIENT GOALS to feel better, regain function   OBJECTIVE:   DIAGNOSTIC FINDINGS:  MRI May 2023 left hip: No evidence of labral tear or other explanation for left hip pain. Both femoral heads appear normal.  PATIENT SURVEYS:  LEFS 9/80 = 11.3% disability  SCREENING FOR RED FLAGS: Bowel or bladder incontinence: No Spinal tumors: No Cauda equina syndrome: No Compression fracture: Yes: healed L1 and L2 Abdominal aneurysm: No  COGNITION:  Overall cognitive status: Within functional limits for tasks assessed     SENSATION: Reports neuropathy in bil hands and feet  MUSCLE LENGTH: HK:VQQV tighness end range Quads:mild ITB: NT Piriformis:WNL Hip Flexors: mild tight left hip flexor Heelcords: WNL     POSTURE: increased lumbar lordosis  PALPATION: Bil gluteals, along SIJ most tender at PSIS, increased tone in right gluteals. TTP in lumbar spine and did not tolerate gentle mobs.  LUMBAR ROM:   Active  A/PROM  eval  Flexion full  Extension 25% and painful  Right lateral flexion Mid shin; pain and jumpy  Left lateral flexion Mid shin; pain and jumpy  Right rotation 90% pain and jumpy  Left rotation 80% pain and jumpy   (Blank rows = not tested)  LOWER EXTREMITY ROM:       Right eval Left eval  Hip flexion WNL WNL  Hip extension    Hip abduction WNL WNL  Hip adduction WNL WNL  Hip internal rotation WNL WNL  Hip external rotation WNL WNL  Knee flexion WNL WNL  Knee extension WNL WNL  Ankle dorsiflexion WNL WNL   (Blank rows = not  tested)  LOWER EXTREMITY MMT:    MMT Right eval Left eval  Hip flexion 4- 5  Hip extension 5 5  Hip abduction 5 5  Hip adduction  5 5  Hip internal rotation 5 5  Hip external rotation 4+ 5  Knee flexion 4 jumpy 5  Knee extension 4+ 5  Ankle dorsiflexion 4- 5   (Blank rows = not tested)  LUMBAR SPECIAL TESTS:  FABER test: neg and SIJ compression feels pain on right SIJ, distraction on left  Relief in prone over one pillow position (pain down to 2/10)  FUNCTIONAL TESTS: TBA next visit 5 times sit to stand:   Timed up and go (TUG):    GAIT: Distance walked: 30 Assistive device utilized: Single point cane Level of assistance: Modified independence Comments: antalgic gait, decreased step length, forward and right trunk lean.    TODAY'S TREATMENT  See HEP   PATIENT EDUCATION:  Education details: PT eval findings, anticipated POC, and need for further assessment of balance   Person educated: Patient Education method: Explanation, Demonstration, Verbal cues, and Handouts Education comprehension: verbalized understanding and returned demonstration   HOME EXERCISE PROGRAM: Access Code: [email or web address] URL: [email or web address] Date: 11/17/2021 Prepared by: Beth Lane  Exercises - Doorway Piriformis Stretch  - 2 x daily - 7 x weekly - 1 sets - 3 reps - 60 sec hold - Supine Butterfly Groin Stretch  - 2 x daily - 7 x weekly - 1 sets - 3 reps - 60 sec hold - Standard Plank  - 2 x daily - 7 x weekly - 1 sets - 5 reps - max hold - Prone Hip Extension - One Pillow  - 5-6 x daily - 7 x weekly - 1 sets - 1 reps - 5 min hold  ASSESSMENT:  CLINICAL IMPRESSION: Beth Lane "Beth Lane" is a 36 y.o. female who was seen today for physical therapy evaluation and treatment for chronic SIJ pain. She has a long h/o of chronic pain throughout her body and many medical treatments have failed to give relief. She presents with painful lumbar and hip ROM limiting standing,  walking and sitting and affecting her ability to work. She has considerable core weakness and right LE weakness affecting her balance. Recent episodes of syncope and falls are being addressed at [facility]. She presently amb with a SPC and has for 6 years. She has limitations in lumbar ROM, hip and core strength, and mild flexibillty deficits. Balance will be assessed next visit and goals set. Due to patient's deficits and her high pain level, she would likely benefit from aquatic therapy in addition to land based PT. Beth Lane has potential for improvement and would benefit from d skilled therapy to address impairments.       OBJECTIVE IMPAIRMENTS Abnormal gait, decreased activity tolerance, decreased balance, decreased ROM, decreased strength, increased muscle spasms, impaired flexibility, impaired sensation, impaired UE functional use, postural dysfunction, obesity, and pain.   ACTIVITY LIMITATIONS sitting, standing, squatting, stairs, and locomotion level  PARTICIPATION LIMITATIONS: cleaning, driving, and occupation  PERSONAL FACTORS Time since onset of injury/illness/exacerbation, Transportation, and 3+ comorbidities: Anxiety/depression, fibromyalgia, scoliosis, PTSD, chronic pain, bil knee pain, left shoulder pain, chronic neck pain, hypoglycemia, spinal fluid on pituitary gland, migraines, conversion spell due to stress, h/o falls, tacchycardia/arythmia getting assessed due to syncope  are also affecting patient's functional outcome.   REHAB POTENTIAL: Good  CLINICAL DECISION MAKING: Stable/uncomplicated  EVALUATION COMPLEXITY: Low   GOALS: Goals reviewed with patient? Yes  SHORT TERM GOALS: Target date: 12/07/2021   Patient will be independent with initial HEP.  Baseline: no HEP Goal status: INITIAL  2.  Balance to be assessed and goals set on 2nd  visit. Baseline: TBA 2nd visit Goal status: INITIAL   LONG TERM GOALS: Target date: 01/18/2022  (Remove Blue  Hyperlink)  Patient will be independent with advanced/ongoing HEP to improve outcomes and carryover.  Baseline: initial HEP Goal status: INITIAL  2.  Patient will report 50% improvement in SIJ pain to improve QOL.  Baseline: 7/10 pain Goal status: INITIAL  3.  Patient will demonstrate smooth functional lumbar ROM to perform ADLs.   Baseline: limitations in ext mainly and poor quality of movement in all planes causing LOB Goal status: INITIAL  4.  Patient will demonstrate improved functional strength and decrease likelihood of falls.as demonstrated by improved 5xSTS time to < 14 sec.  Baseline: to be set 2nd visit Goal status: INITIAL  5.  Patient will report >=18 on LEFS to demonstrate improved functional ability.  Baseline: 9/80 = 11.3% disability Goal status: INITIAL   6.  Patient will tolerate 25% improved tolerance to standing/sitting/walking. Baseline: 10 min/37min/<10 min Goal status: INITIAL  7.  Patient will demonstrate decreased risk for falls as evidenced by improved TUG to < 25 seconds Baseline: to be determined 2nd visit Goal status: INITIAL   PLAN: PT FREQUENCY: 2x/week  PT DURATION: 8 weeks  PLANNED INTERVENTIONS: Therapeutic exercises, Therapeutic activity, Neuromuscular re-education, Balance training, Gait training, Patient/Family education, Self Care, Joint mobilization, Aquatic Therapy, Dry Needling, Electrical stimulation, Spinal mobilization, Cryotherapy, Moist heat, Taping, Ionotophoresis 4mg /ml Dexamethasone, Manual therapy, Re-evaluation, and manual traction .  PLAN FOR NEXT SESSION: Review and progress HEP, Test TUG and 5xSTS and modify goals 4 and 7 as needed, focus on core strength, stabilization, R LE strength and balance.      , PT 12/13/2021, 5:44 PM

## 2021-12-13 NOTE — Progress Notes (Unsigned)
   Established Patient Office Visit  Subjective   Patient ID: Beth Lane, female   DOB: 1986/01/27 Age: 36 y.o. MRN: [id number]   No chief complaint on file.   HPI    Objective:    There were no vitals filed for this visit.  Physical Exam   No results found for this or any previous visit (from the past 24 hour(s)).   {Labs (Optional):23779}  The ASCVD Risk score (Arnett DK, et al., 2019) failed to calculate for the following reasons:   The 2019 ASCVD risk score is only valid for ages 92 to 25   Assessment & Plan:   No problem-specific Assessment & Plan notes found for this encounter.   No follow-ups on file.  ___________________________________________ Thayer Ohm, DNP, APRN, FNP-BC Primary Care and Sports Medicine [facility] [place]

## 2021-12-14 ENCOUNTER — Ambulatory Visit (INDEPENDENT_AMBULATORY_CARE_PROVIDER_SITE_OTHER): Payer: Self-pay | Admitting: Medical-Surgical

## 2021-12-14 ENCOUNTER — Encounter (HOSPITAL_BASED_OUTPATIENT_CLINIC_OR_DEPARTMENT_OTHER): Payer: Self-pay

## 2021-12-14 ENCOUNTER — Encounter: Payer: Self-pay | Admitting: Medical-Surgical

## 2021-12-14 ENCOUNTER — Ambulatory Visit (HOSPITAL_BASED_OUTPATIENT_CLINIC_OR_DEPARTMENT_OTHER): Payer: Managed Care, Other (non HMO) | Admitting: Physical Therapy

## 2021-12-14 DIAGNOSIS — R8290 Unspecified abnormal findings in urine: Secondary | ICD-10-CM

## 2021-12-14 DIAGNOSIS — R590 Localized enlarged lymph nodes: Secondary | ICD-10-CM

## 2021-12-14 DIAGNOSIS — R300 Dysuria: Secondary | ICD-10-CM

## 2021-12-14 DIAGNOSIS — Z91199 Patient's noncompliance with other medical treatment and regimen due to unspecified reason: Secondary | ICD-10-CM

## 2021-12-15 ENCOUNTER — Encounter: Payer: Self-pay | Admitting: Medical-Surgical

## 2021-12-15 ENCOUNTER — Encounter: Payer: Self-pay | Admitting: Physician Assistant

## 2021-12-15 ENCOUNTER — Other Ambulatory Visit (HOSPITAL_COMMUNITY): Payer: Medicaid Other

## 2021-12-15 ENCOUNTER — Encounter: Payer: Self-pay | Admitting: Internal Medicine

## 2021-12-15 ENCOUNTER — Ambulatory Visit (HOSPITAL_BASED_OUTPATIENT_CLINIC_OR_DEPARTMENT_OTHER): Payer: Managed Care, Other (non HMO) | Admitting: Physical Therapy

## 2021-12-17 ENCOUNTER — Ambulatory Visit (HOSPITAL_BASED_OUTPATIENT_CLINIC_OR_DEPARTMENT_OTHER): Payer: Medicaid Other | Admitting: Physical Therapy

## 2021-12-17 ENCOUNTER — Other Ambulatory Visit: Payer: Self-pay | Admitting: Medical-Surgical

## 2021-12-17 ENCOUNTER — Encounter: Payer: Medicaid Other | Admitting: Physical Therapy

## 2021-12-17 MED ORDER — VYVANSE 40 MG PO CAPS: 40.0000 mg | ORAL_CAPSULE | Freq: Every morning | ORAL | 0 refills | Status: DC

## 2021-12-18 ENCOUNTER — Ambulatory Visit (HOSPITAL_BASED_OUTPATIENT_CLINIC_OR_DEPARTMENT_OTHER): Payer: Managed Care, Other (non HMO) | Admitting: Physical Therapy

## 2021-12-18 ENCOUNTER — Encounter (HOSPITAL_BASED_OUTPATIENT_CLINIC_OR_DEPARTMENT_OTHER): Payer: Self-pay

## 2021-12-19 ENCOUNTER — Encounter: Payer: Self-pay | Admitting: Internal Medicine

## 2021-12-22 ENCOUNTER — Ambulatory Visit (HOSPITAL_BASED_OUTPATIENT_CLINIC_OR_DEPARTMENT_OTHER): Payer: Medicaid Other | Admitting: Physical Therapy

## 2021-12-23 ENCOUNTER — Ambulatory Visit (HOSPITAL_BASED_OUTPATIENT_CLINIC_OR_DEPARTMENT_OTHER): Payer: Managed Care, Other (non HMO) | Attending: Internal Medicine | Admitting: Physical Therapy

## 2021-12-23 ENCOUNTER — Ambulatory Visit (HOSPITAL_BASED_OUTPATIENT_CLINIC_OR_DEPARTMENT_OTHER): Payer: Managed Care, Other (non HMO) | Admitting: Physical Therapy

## 2021-12-23 ENCOUNTER — Encounter (HOSPITAL_BASED_OUTPATIENT_CLINIC_OR_DEPARTMENT_OTHER): Payer: Self-pay

## 2021-12-24 ENCOUNTER — Encounter: Payer: Self-pay | Admitting: Medical-Surgical

## 2021-12-24 ENCOUNTER — Ambulatory Visit (HOSPITAL_BASED_OUTPATIENT_CLINIC_OR_DEPARTMENT_OTHER): Payer: Medicaid Other | Admitting: Physical Therapy

## 2021-12-25 ENCOUNTER — Ambulatory Visit (HOSPITAL_BASED_OUTPATIENT_CLINIC_OR_DEPARTMENT_OTHER): Payer: Managed Care, Other (non HMO) | Admitting: Physical Therapy

## 2021-12-25 ENCOUNTER — Encounter (HOSPITAL_BASED_OUTPATIENT_CLINIC_OR_DEPARTMENT_OTHER): Payer: Self-pay

## 2021-12-28 ENCOUNTER — Other Ambulatory Visit (HOSPITAL_COMMUNITY): Payer: Managed Care, Other (non HMO)

## 2021-12-28 ENCOUNTER — Encounter (HOSPITAL_COMMUNITY): Payer: Self-pay

## 2021-12-29 ENCOUNTER — Ambulatory Visit: Payer: Managed Care, Other (non HMO) | Admitting: Physical Therapy

## 2021-12-30 DIAGNOSIS — Z1231 Encounter for screening mammogram for malignant neoplasm of breast: Secondary | ICD-10-CM

## 2022-01-27 ENCOUNTER — Encounter: Payer: Self-pay | Admitting: Medical-Surgical

## 2022-01-27 ENCOUNTER — Telehealth: Payer: Self-pay

## 2022-01-27 NOTE — Telephone Encounter (Addendum)
Initiated Prior authorization HYQ:MVHQI (fremanezumab-vfrm) injection 225MG /1.5ML syringes Via: Covermymeds Case/Key:[id number]  Status: denied as of 01/27/22 Reason:Per your health plan's criteria, this drug is covered if you meet the following: (1) Medication overuse headache has been considered and you have stopped drugs that can possibly cause this type of headache (potentially offending drugs have been discontinued). (2) You have greater than or equal to 15 headache days per month, of which at least 8 must be migraine days for at least 3 months. (3) Two of the following: (A) You have tried amitriptyline or venlafaxine for at least two months or cannot use the drugs. (B) You have tried divalproex sodium or topiramate for at least two months or cannot use the drugs. (C) You have tried one beta blocker drug (that is, atenolol, propranolol, nadolol, timolol, or metoprolol) for at least two months or cannot use the drugs. (D) You have tried candesartan for at least two months or cannot use the drug. The information provided does not show that you meet the criteria listed above.  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

## 2022-01-28 ENCOUNTER — Other Ambulatory Visit: Payer: Self-pay | Admitting: Sports Medicine

## 2022-01-28 ENCOUNTER — Encounter: Payer: Self-pay | Admitting: Medical-Surgical

## 2022-01-28 DIAGNOSIS — M797 Fibromyalgia: Secondary | ICD-10-CM

## 2022-01-28 MED ORDER — PREGABALIN 150 MG PO CAPS: 150.0000 mg | ORAL_CAPSULE | Freq: Three times a day (TID) | ORAL | 0 refills | Status: DC

## 2022-01-28 NOTE — Telephone Encounter (Signed)
Must attend scheduled appointment on 02/01/2022 for any further refills.

## 2022-02-01 ENCOUNTER — Encounter: Payer: Self-pay | Admitting: Medical-Surgical

## 2022-02-01 ENCOUNTER — Encounter (HOSPITAL_COMMUNITY): Payer: Self-pay

## 2022-02-01 ENCOUNTER — Telehealth (INDEPENDENT_AMBULATORY_CARE_PROVIDER_SITE_OTHER): Payer: Managed Care, Other (non HMO) | Admitting: Medical-Surgical

## 2022-02-01 ENCOUNTER — Ambulatory Visit (HOSPITAL_COMMUNITY): Payer: Managed Care, Other (non HMO)

## 2022-02-01 DIAGNOSIS — K625 Hemorrhage of anus and rectum: Secondary | ICD-10-CM | POA: Diagnosis not present

## 2022-02-01 DIAGNOSIS — N926 Irregular menstruation, unspecified: Secondary | ICD-10-CM

## 2022-02-01 NOTE — Progress Notes (Signed)
Virtual Visit via Video Note  I connected with Beth Lane on 02/01/22 at 10:30 AM EST by a video enabled telemedicine application and verified that I am speaking with the correct person using two identifiers.   I discussed the limitations of evaluation and management by telemedicine and the availability of in person appointments. The patient expressed understanding and agreed to proceed.  Patient location: home Provider locations: office  Subjective:    CC: Menstrual problem  HPI: Pleasant 36 year old female presenting via MyChart video visit with reports of unusual menstrual bleeding.  Has a history of having extended heavy periods but her last period was only 4 days long.  She stopped bleeding for about 1 week but then started having extremely heavy menstrual bleeding that saturated a pad/tampon every hour for the first day or so.  After that it slowed down to having to change them every 2-3 hours.  She reports that the blood was gushing, with clots.  She developed lightheadedness and weakness.  The bleeding finally stopped yesterday after lasting for 5 days.    She also noted that she had blood in her stool yesterday and today.  Her stools are not hard to pass and have been normal overall but she has seen bright red in the toilet after having bowel movements.  She does have a history of having hemorrhoids.  Past medical history, Surgical history, Family history not pertinant except as noted below, Social history, Allergies, and medications have been entered into the medical record, reviewed, and corrections made.   Review of Systems: See HPI for pertinent positives and negatives.   Objective:    General: Speaking clearly in complete sentences without any shortness of breath.  Alert and oriented x3.  Normal judgment. No apparent acute distress.  Impression and Recommendations:    1. Menstrual bleeding problem Unclear etiology.  Checking labs as below.  Will go ahead  and order a pelvic ultrasound for further investigation. - CBC with Differential/Platelet - TSH - Prolactin - Estradiol - Testosterone - Progesterone - FSH/LH - US Pelvic Complete With Transvaginal; Future - Comprehensive Metabolic Panel (CMET)  2. BRBPR (bright red blood per rectum) Checking blood counts.  Suspect this is related to bleeding hemorrhoids.  Recommend using over-the-counter suppositories or hydrocortisone cream.  Can also use conservative measures such as witch hazel, Tucks pads, etc.  If bleeding worsens, return for further evaluation with an in person appointment. - CBC with Differential/Platelet  I discussed the assessment and treatment plan with the patient. The patient was provided an opportunity to ask questions and all were answered. The patient agreed with the plan and demonstrated an understanding of the instructions.   The patient was advised to call back or seek an in-person evaluation if the symptoms worsen or if the condition fails to improve as anticipated.  25 minutes of non-face-to-face time was provided during this encounter.  Return if symptoms worsen or fail to improve.  Thayer Ohm, DNP, APRN, FNP-BC [facility]

## 2022-02-02 ENCOUNTER — Encounter (HOSPITAL_COMMUNITY): Payer: Self-pay | Admitting: Internal Medicine

## 2022-02-03 ENCOUNTER — Other Ambulatory Visit: Payer: Managed Care, Other (non HMO)

## 2022-02-11 ENCOUNTER — Other Ambulatory Visit: Payer: Self-pay

## 2022-02-11 MED ORDER — FLUTICASONE-SALMETEROL 250-50 MCG/ACT IN AEPB: 1.0000 | INHALATION_SPRAY | Freq: Two times a day (BID) | RESPIRATORY_TRACT | 0 refills | Status: DC

## 2022-02-14 ENCOUNTER — Other Ambulatory Visit: Payer: Self-pay | Admitting: Medical-Surgical

## 2022-02-16 DIAGNOSIS — E236 Other disorders of pituitary gland: Secondary | ICD-10-CM | POA: Insufficient documentation

## 2022-02-17 ENCOUNTER — Other Ambulatory Visit: Payer: Commercial Managed Care - HMO

## 2022-02-17 MED ORDER — VYVANSE 40 MG PO CAPS: 40.0000 mg | ORAL_CAPSULE | Freq: Every morning | ORAL | 0 refills | Status: DC

## 2022-02-17 NOTE — Telephone Encounter (Signed)
Due for follow up in March. Please contact patient to schedule to prevent delays in refills. Will need to be an in office appointment. 0.

## 2022-02-18 ENCOUNTER — Ambulatory Visit: Payer: Commercial Managed Care - HMO | Admitting: "Endocrinology

## 2022-02-18 ENCOUNTER — Other Ambulatory Visit: Payer: Self-pay | Admitting: Medical-Surgical

## 2022-02-18 DIAGNOSIS — M797 Fibromyalgia: Secondary | ICD-10-CM

## 2022-02-18 NOTE — Telephone Encounter (Signed)
Last written 01/28/2022 #90 no refills   Last appt 02/01/2022

## 2022-02-18 NOTE — Telephone Encounter (Signed)
Patient is due for follow up in March. Please contact patient to schedule to prevent delays in refills. Will need to be an in office appointment for rx follow up.

## 2022-02-19 MED ORDER — PREGABALIN 150 MG PO CAPS: 150.0000 mg | ORAL_CAPSULE | Freq: Three times a day (TID) | ORAL | 3 refills | Status: DC

## 2022-03-15 ENCOUNTER — Ambulatory Visit: Payer: Commercial Managed Care - HMO | Admitting: Medical-Surgical

## 2022-03-25 ENCOUNTER — Other Ambulatory Visit: Payer: Self-pay | Admitting: Medical-Surgical

## 2022-03-25 MED ORDER — VYVANSE 40 MG PO CAPS: 40.0000 mg | ORAL_CAPSULE | Freq: Every morning | ORAL | 0 refills | Status: DC

## 2022-03-30 ENCOUNTER — Encounter: Payer: Self-pay | Admitting: Medical-Surgical

## 2022-04-06 ENCOUNTER — Ambulatory Visit (INDEPENDENT_AMBULATORY_CARE_PROVIDER_SITE_OTHER): Payer: Self-pay | Admitting: Medical-Surgical

## 2022-04-06 ENCOUNTER — Telehealth: Payer: Self-pay | Admitting: Medical-Surgical

## 2022-04-06 DIAGNOSIS — Z91199 Patient's noncompliance with other medical treatment and regimen due to unspecified reason: Secondary | ICD-10-CM

## 2022-04-06 NOTE — Telephone Encounter (Signed)
Pt called at 1:00 to cancel her appointment at 1:40 due to a family emergency. Do you want me to cancel this appointment or leave it on the schedule?

## 2022-04-06 NOTE — Progress Notes (Signed)
   Complete physical exam  Patient: Beth Lane   DOB: 12/05/1996   37 y.o. Female  MRN: [id number]  Subjective:    No chief complaint on file.   Beth Lane is a 37 y.o. female who presents today for a complete physical exam. She reports consuming a {diet types:17450} diet. {types:19826} She generally feels {DESC; WELL/FAIRLY WELL/POORLY:18703}. She reports sleeping {DESC; WELL/FAIRLY WELL/POORLY:18703}. She {does/does not:200015} have additional problems to discuss today.    Most recent fall risk assessment:    08/12/2021   10:42 AM  Fall Risk   Falls in the past year? 0  Number falls in past yr: 0  Injury with Fall? 0  Risk for fall due to : No Fall Risks  Follow up Falls evaluation completed     Most recent depression screenings:    08/12/2021   10:42 AM 07/03/2020   10:46 AM  PHQ 2/9 Scores  PHQ - 2 Score 0 0  PHQ- 9 Score 5     {VISON DENTAL STD PSA (Optional):27386}  {History (Optional):23778}  Patient Care Team: Kazuto Sevey, NP as PCP - General (Nurse Practitioner)   Outpatient Medications Prior to Visit  Medication Sig   fluticasone (FLONASE) 50 MCG/ACT nasal spray Place 2 sprays into both nostrils in the morning and at bedtime. After 7 days, reduce to once daily.   norgestimate-ethinyl estradiol (SPRINTEC 28) 0.25-35 MG-MCG tablet Take 1 tablet by mouth daily.   Nystatin POWD Apply liberally to affected area 2 times per day   spironolactone (ALDACTONE) 100 MG tablet Take 1 tablet (100 mg total) by mouth daily.   No facility-administered medications prior to visit.    ROS        Objective:     There were no vitals taken for this visit. {Vitals History (Optional):23777}  Physical Exam   No results found for any visits on 09/17/21. {Show previous labs (optional):23779}    Assessment & Plan:    Routine Health Maintenance and Physical Exam  Immunization History  Administered Date(s) Administered   DTaP 02/18/1999, 04/16/1999,  06/25/1999, 03/10/2000, 09/24/2003   Hepatitis A 07/21/2007, 07/26/2008   Hepatitis B 12/06/1998, 01/13/1999, 06/25/1999   HiB (PRP-OMP) 02/18/1999, 04/16/1999, 06/25/1999, 03/10/2000   IPV 02/18/1999, 04/16/1999, 12/14/1999, 09/24/2003   Influenza,inj,Quad PF,6+ Mos 10/26/2013   Influenza-Unspecified 01/26/2012   MMR 12/13/2000, 09/24/2003   Meningococcal Polysaccharide 07/26/2011   Pneumococcal Conjugate-13 03/10/2000   Pneumococcal-Unspecified 06/25/1999, 09/08/1999   Tdap 07/26/2011   Varicella 12/14/1999, 07/21/2007    Health Maintenance  Topic Date Due   HIV Screening  Never done   Hepatitis C Screening  Never done   INFLUENZA VACCINE  09/15/2021   PAP-Cervical Cytology Screening  09/17/2021 (Originally 12/05/2019)   PAP SMEAR-Modifier  09/17/2021 (Originally 12/05/2019)   TETANUS/TDAP  09/17/2021 (Originally 07/25/2021)   HPV VACCINES  Discontinued   COVID-19 Vaccine  Discontinued    Discussed health benefits of physical activity, and encouraged her to engage in regular exercise appropriate for her age and condition.  Problem List Items Addressed This Visit   None Visit Diagnoses     Annual physical exam    -  Primary   Cervical cancer screening       Need for Tdap vaccination          No follow-ups on file.     Jaydynn Wolford, NP   

## 2022-04-12 ENCOUNTER — Ambulatory Visit (HOSPITAL_COMMUNITY): Payer: Medicaid Other | Attending: Cardiology

## 2022-04-12 DIAGNOSIS — R55 Syncope and collapse: Secondary | ICD-10-CM

## 2022-04-12 DIAGNOSIS — R072 Precordial pain: Secondary | ICD-10-CM

## 2022-04-12 DIAGNOSIS — R002 Palpitations: Secondary | ICD-10-CM

## 2022-04-12 LAB — ECHOCARDIOGRAM COMPLETE
Area-P 1/2: 4.53 cm2
S' Lateral: 3.1 cm

## 2022-04-12 MED ORDER — PERFLUTREN LIPID MICROSPHERE
1.0000 mL | INTRAVENOUS | Status: AC | PRN
  Administered 2022-04-12: 2 mL via INTRAVENOUS

## 2022-04-15 ENCOUNTER — Ambulatory Visit: Payer: Commercial Managed Care - HMO | Admitting: Medical-Surgical

## 2022-04-21 ENCOUNTER — Encounter: Payer: Self-pay | Admitting: "Endocrinology

## 2022-04-29 ENCOUNTER — Other Ambulatory Visit: Payer: Self-pay | Admitting: Medical-Surgical

## 2022-04-29 ENCOUNTER — Encounter: Payer: Self-pay | Admitting: Medical-Surgical

## 2022-04-29 ENCOUNTER — Encounter: Payer: Self-pay | Admitting: Sports Medicine

## 2022-04-29 DIAGNOSIS — M797 Fibromyalgia: Secondary | ICD-10-CM

## 2022-04-29 NOTE — Telephone Encounter (Signed)
Needs an in person appointment for refills.

## 2022-05-10 ENCOUNTER — Ambulatory Visit: Payer: Commercial Managed Care - HMO | Admitting: "Endocrinology

## 2022-05-24 ENCOUNTER — Encounter: Payer: Self-pay | Admitting: "Endocrinology

## 2022-05-24 ENCOUNTER — Other Ambulatory Visit: Payer: Self-pay | Admitting: Medical-Surgical

## 2022-05-24 DIAGNOSIS — F411 Generalized anxiety disorder: Secondary | ICD-10-CM

## 2022-05-24 DIAGNOSIS — F324 Major depressive disorder, single episode, in partial remission: Secondary | ICD-10-CM

## 2022-05-24 DIAGNOSIS — F410 Panic disorder [episodic paroxysmal anxiety] without agoraphobia: Secondary | ICD-10-CM

## 2022-05-27 ENCOUNTER — Ambulatory Visit: Payer: Commercial Managed Care - HMO | Admitting: Medical-Surgical

## 2022-06-28 ENCOUNTER — Ambulatory Visit: Payer: Medicaid Other | Admitting: Medical-Surgical

## 2022-06-28 ENCOUNTER — Encounter: Payer: Self-pay | Admitting: Medical-Surgical

## 2022-06-28 ENCOUNTER — Encounter: Payer: Self-pay | Admitting: "Endocrinology

## 2022-06-28 ENCOUNTER — Ambulatory Visit (INDEPENDENT_AMBULATORY_CARE_PROVIDER_SITE_OTHER): Payer: Medicaid Other | Admitting: "Endocrinology

## 2022-06-28 ENCOUNTER — Ambulatory Visit (INDEPENDENT_AMBULATORY_CARE_PROVIDER_SITE_OTHER): Payer: Medicaid Other | Admitting: Medical-Surgical

## 2022-06-28 VITALS — BP 136/78 | HR 92 | Ht 65.0 in | Wt 266.0 lb

## 2022-06-28 DIAGNOSIS — Z1329 Encounter for screening for other suspected endocrine disorder: Secondary | ICD-10-CM

## 2022-06-28 DIAGNOSIS — F411 Generalized anxiety disorder: Secondary | ICD-10-CM

## 2022-06-28 DIAGNOSIS — F410 Panic disorder [episodic paroxysmal anxiety] without agoraphobia: Secondary | ICD-10-CM

## 2022-06-28 DIAGNOSIS — E236 Other disorders of pituitary gland: Secondary | ICD-10-CM | POA: Diagnosis not present

## 2022-06-28 DIAGNOSIS — F324 Major depressive disorder, single episode, in partial remission: Secondary | ICD-10-CM

## 2022-06-28 DIAGNOSIS — G43909 Migraine, unspecified, not intractable, without status migrainosus: Secondary | ICD-10-CM

## 2022-06-28 DIAGNOSIS — Z131 Encounter for screening for diabetes mellitus: Secondary | ICD-10-CM

## 2022-06-28 DIAGNOSIS — K219 Gastro-esophageal reflux disease without esophagitis: Secondary | ICD-10-CM

## 2022-06-28 DIAGNOSIS — M797 Fibromyalgia: Secondary | ICD-10-CM

## 2022-06-28 DIAGNOSIS — Z01419 Encounter for gynecological examination (general) (routine) without abnormal findings: Secondary | ICD-10-CM

## 2022-06-28 DIAGNOSIS — Z0000 Encounter for general adult medical examination without abnormal findings: Secondary | ICD-10-CM

## 2022-06-28 DIAGNOSIS — Z13220 Encounter for screening for lipoid disorders: Secondary | ICD-10-CM

## 2022-06-28 MED ORDER — DICLOFENAC SODIUM 1 % EX GEL: CUTANEOUS | 3 refills | Status: DC

## 2022-06-28 MED ORDER — PRAZOSIN HCL 1 MG PO CAPS: ORAL_CAPSULE | ORAL | 1 refills | Status: DC

## 2022-06-28 MED ORDER — FLUTICASONE-SALMETEROL 250-50 MCG/ACT IN AEPB: 1.0000 | INHALATION_SPRAY | Freq: Two times a day (BID) | RESPIRATORY_TRACT | 1 refills | Status: DC

## 2022-06-28 MED ORDER — VYVANSE 40 MG PO CAPS: 40.0000 mg | ORAL_CAPSULE | Freq: Every morning | ORAL | 0 refills | Status: DC

## 2022-06-28 MED ORDER — 1ST MEDX-PATCH/ LIDOCAINE 4-0.025-5-20 % EX PTCH: MEDICATED_PATCH | CUTANEOUS | 3 refills | Status: DC

## 2022-06-28 MED ORDER — ESOMEPRAZOLE MAGNESIUM 40 MG PO CPDR: DELAYED_RELEASE_CAPSULE | ORAL | 1 refills | Status: DC

## 2022-06-28 MED ORDER — PREGABALIN 150 MG PO CAPS: 150.0000 mg | ORAL_CAPSULE | Freq: Three times a day (TID) | ORAL | 5 refills | Status: DC

## 2022-06-28 MED ORDER — LIDOCAINE-PRILOCAINE 2.5-2.5 % EX CREA: TOPICAL_CREAM | Freq: Three times a day (TID) | CUTANEOUS | 3 refills | Status: DC | PRN

## 2022-06-28 MED ORDER — PAROXETINE HCL 40 MG PO TABS: ORAL_TABLET | ORAL | 1 refills | Status: DC

## 2022-06-28 NOTE — Progress Notes (Signed)
Endocrinology Consult Note                                            06/28/2022, 5:20 PM   Subjective:    Patient ID: Beth Lane, female    DOB: 1985-03-29, PCP [name], NP   Past Medical History:  Diagnosis Date   Allergy    Imatrex and plastic tape   Arthritis    In knees, back, wrist, neck   Asthma    At age 37   Chronic pain    Clotting disorder (HCC)    Rectal bleeding at times, hemroids, ulcer   Depression    At age 19 but worse after my son died in 2009/07/31   Fibromyalgia    Generalized anxiety disorder    GERD (gastroesophageal reflux disease)    Hypertension    About 4 years ago   Migraine syndrome    Osteoporosis    I tend to fracture bones easily and doctor wanted a bone density test in 01-Aug-2010 but found out I was pregnant and was never followed up with   PTSD (post-traumatic stress disorder)    Scoliosis    Stroke (HCC)    Body reacted as a stroke but not an actual stroke. 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 Lost feeling and control on right side   Ulcer    Past Surgical History:  Procedure Laterality Date   CESAREAN SECTION     CHOLECYSTECTOMY     TUBAL LIGATION     Social History   Socioeconomic History   Marital status: Married    Spouse name: Not on file   Number of children: Not on file   Years of education: Not on file   Highest education level: Some college, no degree  Occupational History   Not on file  Tobacco Use   Smoking status: Former    Types: E-cigarettes   Smokeless tobacco: Never  Vaping Use   Vaping Use: Every day  Substance and Sexual Activity   Alcohol use: Never   Drug use: Never   Sexual activity: Yes    Birth control/protection: Other-see comments    Comment: Lesbian  Other Topics Concern   Not on file  Social History Narrative   Not on file   Social Determinants of Health   Financial Resource Strain: Patient Declined (06/27/2022)   Overall Financial Resource Strain  (CARDIA)    Difficulty of Paying Living Expenses: Patient declined  Food Insecurity: Food Insecurity Present (06/27/2022)   Hunger Vital Sign    Worried About Running Out of Food in the Last Year: Sometimes true    Ran Out of Food in the Last Year: Sometimes true  Transportation Needs: Unmet Transportation Needs (06/27/2022)   PRAPARE - Administrator, Civil Service (Medical): Yes    Lack of Transportation (Non-Medical): No  Physical Activity: Insufficiently Active (06/27/2022)   Exercise Vital Sign    Days of Exercise per Week: 1 day    Minutes of Exercise per Session: 20 min  Stress: Stress Concern Present (06/27/2022)   [employer] of Occupational Health - Occupational Stress Questionnaire    Feeling of Stress : Rather much  Social Connections: Unknown (06/27/2022)   Social Connection and Isolation Panel [NHANES]    Frequency of Communication with Friends and Family: Never  Frequency of Social Gatherings with Friends and Family: Never    Attends Religious Services: Patient declined    Active Member of Clubs or Organizations: No    Attends Engineer, structural: Not on file    Marital Status: Married   Family History  Problem Relation Age of Onset   Hypertension Mother    Cancer Mother    Bipolar disorder Mother    Fibromyalgia Mother    ADD / ADHD Mother    Anxiety disorder Mother    Arthritis Mother    COPD Mother    Depression Mother    Diabetes Mother    Obesity Mother    Stroke Father    Post-traumatic stress disorder Father    Heart attack Father    ADD / ADHD Father    Anxiety disorder Father    Asthma Father    Alcohol abuse Maternal Grandfather    ADD / ADHD Daughter    ADD / ADHD Son    Anxiety disorder Son    ADD / ADHD Brother    ADD / ADHD Sister    Anxiety disorder Son    Intellectual disability Son    Anxiety disorder Son    Hearing loss Son    Learning disabilities Son    Early death Son    Learning disabilities Son     Miscarriages / [place] Sister    Outpatient Encounter Medications as of 06/28/2022  Medication Sig   Aluminum & Magnesium Hydroxide (MAGNESIUM-ALUMINUM PO) Take by mouth.   blood glucose meter kit and supplies KIT Dispense based on patient and insurance preference. Use up to four times daily as directed. Please include lancets, test strips, control solution.   diclofenac Sodium (VOLTAREN) 1 % GEL Apply 2 Grams topically to affected areas 3 times daily as needed   esomeprazole (NEXIUM) 40 MG capsule TAKE ONE CAPSULE BY MOUTH DAILY AT NOON   fluticasone-salmeterol (ADVAIR DISKUS) 250-50 MCG/ACT AEPB Inhale 1 puff into the lungs 2 (two) times daily. in the morning and at bedtime.   Fremanezumab-vfrm (AJOVY) 225 MG/1.5ML SOSY INJECT 225 MG UNDER THE SKIN EVERY 30 DAYS   Lido-Capsaicin-Men-Methyl Sal (1ST MEDX-PATCH/ LIDOCAINE) 4-0.025-5-20 % PTCH Apply topically as instructed.   lidocaine (LIDODERM) 5 % Place 1 patch onto the skin daily.   lidocaine-prilocaine (EMLA) cream Apply topically 3 (three) times daily as needed. SMARTSIG:2 Gram(s) Topical 3 Times Daily PRN Strength: 2.5-2.5 %   linaCLOtide (LINZESS PO) Take by mouth.   methocarbamol (ROBAXIN) 750 MG tablet Take 1 tablet (750 mg total) by mouth every 6 (six) hours as needed.   Oxycodone HCl 10 MG TABS Take 10 mg by mouth every 6 (six) hours as needed.   PARoxetine (PAXIL) 40 MG tablet TAKE 1 TABLET(40 MG) BY MOUTH DAILY   prazosin (MINIPRESS) 1 MG capsule TAKE 3 CAPSULES(3 MG) BY MOUTH AT BEDTIME   pregabalin (LYRICA) 150 MG capsule Take 1 capsule (150 mg total) by mouth 3 (three) times daily.   Ubrogepant (UBRELVY) 50 MG TABS TAKE 1 TABLET BY MOUTH AS NEEDED FOR MIGRAINE   VYVANSE 40 MG capsule Take 1 capsule (40 mg total) by mouth every morning. NO FURTHER REFILLS UNTIL IN PERSON APPT   No facility-administered encounter medications on file as of 06/28/2022.   ALLERGIES: Allergies  Allergen Reactions   Sumatriptan Anaphylaxis    Barium Sulfate Nausea And Vomiting   Tape Rash    VACCINATION STATUS: Immunization History  Administered Date(s) Administered   Influenza-Unspecified  01/03/2013, 01/27/2018   MMR 08/08/2009   PPD Test 01/05/2013   Pneumococcal Polysaccharide-23 09/19/2010   Td 09/05/2014   Tdap 09/19/2010, 09/27/2011, 01/03/2013    HPI Beth Lane is 37 y.o. female who presents today with a medical history as above. she is being seen in consultation for empty sella requested by [name], NP.   She is accompanied to clinic by her spouse. History is obtained mostly from chart review and the catheter. She was found incidentally to have empty sella on MRI of the brain done in May 2023 when there was a syncopal episode.  This patient has no prior history of endocrine deficits, visual field deficits.  Patient is known to have migraine headaches.  Patient is status post 5 successful pregnancies, currently not taking any more fertility. No recent endocrine assessment to review.  Thyroid function test last year was normal. Patient presents with progressive weight gain, fatigue, on and off headaches.  Patient is on multiple medications including oxycodone, Robaxin, Nexium, Paxil, Linzess, Lyrica, Advair. There is approximately 20 pounds of weight gain over the last year. 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  Patient walks with a cane mostly due to imbalance. Patient reports some cognitive problems, depression, disequilibrium, as well as heavy..   Review of Systems  Constitutional: + Progressive weight gain, + fatigue, no subjective hyperthermia, no subjective hypothermia Eyes: no blurry vision, no xerophthalmia ENT: no sore throat, no nodules palpated in throat, no dysphagia/odynophagia, no hoarseness Cardiovascular: no Chest Pain, no Shortness of Breath, no palpitations, no leg swelling Respiratory: no cough, no shortness of breath Gastrointestinal: no  Nausea/Vomiting/Diarhhea Musculoskeletal: + Diffuse body aches. Skin: no rashes Neurological: no tremors, no numbness, no tingling, no dizziness Psychiatric: no depression, no anxiety  Objective:       06/28/2022    1:38 PM 06/28/2022   10:38 AM 12/09/2021   12:17 PM  Vitals with BMI  Height 5\' 5"  5\' 5"    Weight 266 lbs 266 lbs   BMI 44.26 44.26   Systolic 136 121 098  Diastolic 78 73 84  Pulse 92 83 74    BP 136/78   Pulse 92   Ht 5\' 5"  (1.651 m)   Wt 266 lb (120.7 kg)   BMI 44.26 kg/m   Wt Readings from Last 3 Encounters:  06/28/22 266 lb (120.7 kg)  06/28/22 266 lb (120.7 kg)  11/25/21 248 lb (112.5 kg)    Physical Exam  Constitutional:  Body mass index is 44.26 kg/m.,  not in acute distress, normal state of mind Eyes: PERRLA, EOMI, no exophthalmos ENT: moist mucous membranes, no gross thyromegaly, no gross cervical lymphadenopathy Cardiovascular: normal precordial activity, Regular Rate and Rhythm, no Murmur/Rubs/Gallops Respiratory:  adequate breathing efforts, no gross chest deformity, Clear to auscultation bilaterally Gastrointestinal: abdomen soft, Non -tender, No distension, Bowel Sounds present, no gross organomegaly Musculoskeletal: no gross deformities, strength intact in all four extremities, no peripheral edema Skin: moist, warm, no rashes Neurological: no tremor with outstretched hands, Deep tendon reflexes normal in bilateral lower extremities.  CMP ( most recent) CMP     Component Value Date/Time   NA 139 11/25/2021 1552   K 4.1 11/25/2021 1552   CL 103 11/25/2021 1552   CO2 22 11/25/2021 1552   GLUCOSE 88 11/25/2021 1552   GLUCOSE 99 07/06/2021 1112   BUN 15 11/25/2021 1552   CREATININE 0.80 11/25/2021 1552   CREATININE 0.71 04/06/2021 0000   CALCIUM 9.2 11/25/2021 1552  PROT 7.2 06/01/2021 2051   ALBUMIN 3.9 06/01/2021 2051   AST 15 06/01/2021 2051   ALT 19 06/01/2021 2051   ALKPHOS 50 06/01/2021 2051   BILITOT 0.6 06/01/2021 2051    GFRNONAA >60 07/06/2021 1112     Diabetic Labs (most recent): Lab Results  Component Value Date   HGBA1C 4.7 (L) 07/14/2021      Lab Results  Component Value Date   TSH 2.170 07/14/2021     MRI of brain without contrast on Jul 06, 2021 IMPRESSION: Mildly motion degraded exam.   No evidence of acute intracranial abnormality.   Partially empty sella turcica. While this finding often reflects incidental anatomic variation, it can also be associated with idiopathic intracranial hypertension (pseudotumor cerebri).   Small-volume fluid within the left mastoid air cells.   Assessment & Plan:   1. Empty sella syndrome (HCC)  - Beth Lane  is being seen at a kind request of [name], NP. - I have reviewed her available  records and clinically evaluated the patient. - Based on these reviews, she has partially empty sella,  however,  there is not sufficient information to proceed with definitive treatment plan. -No clear evidence of endocrine deficit or mass effect at this time.  Patient has migraine headaches per history, as well as small volume fluid within the left mastoid air cells.   Patient will be considered for a.m. cortisol, thyroid function test, prolactin as well as CMP. If there is any indication of secondary hypothyroidism, patient will be considered for repeat MRI.   - I did not initiate any new prescriptions today.  In light of her concern about progressive weight gain, patient will be given lifestyle medicine packet during next visit.   - she is advised to maintain close follow up with [name], NP for primary care needs.   - Time spent with the patient: 45 minutes, of which >50% was spent in  counseling her about her empty sella and the rest in obtaining information about her symptoms, reviewing her previous labs/studies ( including abstractions from other facilities),  evaluations, and treatments,  and developing a plan to confirm diagnosis  and long term treatment based on the latest standards of care/guidelines; and documenting her care.  Beth Lane participated in the discussions, expressed understanding, and voiced agreement with the above plans.  All questions were answered to her satisfaction. she is encouraged to contact clinic should she have any questions or concerns prior to her return visit.  Follow up plan: Return in about 10 days (around 07/08/2022), or Labsat or before 8AM, for F/U with Pre-visit Labs.   Marquis Lunch, MD [facility] [address] [place], [place] [address] Phone: [phone]  Fax: [phone]     06/28/2022, 5:20 PM  This note was partially dictated with voice recognition software. Similar sounding words can be transcribed inadequately or may not  be corrected upon review.

## 2022-06-28 NOTE — Progress Notes (Signed)
        Established patient visit  History, exam, impression, and plan:  1. Morbid obesity (HCC) Pleasant 37 year old female presenting today with reports of continued weight gain and the desire for help with weight loss. Endorses dietary modifications and working to be more active but has been unable to lose weight. Currently on Vyvanse and chronic opioids so phentermine is contraindicated. Unable to use Contrave due to Naltrexone and her treatment with opioids. Insurance coverage limitations make injectables not financially feasible. Checking TSH today. Referring to [facility] location in [place] per patient request.  - TSH - Amb Ref to Medical Weight Management  2. Healthcare maintenance Checking labs below.  - CBC with Differential/Platelet - COMPLETE METABOLIC PANEL WITH GFR  3. Lipid screening Checking lipids.  - Lipid panel  4. Diabetes mellitus screening Checking A1c.  - Hemoglobin A1c  5. Thyroid disorder screen Checking TSH.  - TSH  6. Gastroesophageal reflux disease, unspecified whether esophagitis present History of GERD currently well managed with Nexium 40mg  daily. Tolerating well without side effects. Has tried to wean off in the past but symptoms return.  - esomeprazole (NEXIUM) 40 MG capsule; TAKE ONE CAPSULE BY MOUTH DAILY AT NOON  Dispense: 90 capsule; Refill: 1  7. Major depressive disorder with single episode, in partial remission (HCC) 8. Generalized anxiety disorder with panic attacks Long history of mood concerns extending to childhood. Taking Paxil 40mg  daily and Prazosin 3mg  nightly. Doesn't feel like the medication is working well at this point but also endorses that she has had some recent stressors that have exacerbated her issues. Not resting well due to multiple factors. Was doing counseling but has not in the past couple of months. Has plans to get reconnected in the [place] areas with a prior counselor and psychiatrist. Plans to ask about trying  something different once she has reestablished with them.  - PARoxetine (PAXIL) 40 MG tablet; TAKE 1 TABLET(40 MG) BY MOUTH DAILY  Dispense: 90 tablet; Refill: 1 - prazosin (MINIPRESS) 1 MG capsule; TAKE 3 CAPSULES(3 MG) BY MOUTH AT BEDTIME  Dispense: 270 capsule; Refill: 1  9. Fibromyalgia affecting multiple sites Has been taking Lyrica 150mg  TID and using methocarbamol, lidocaine cream, lidocaine patches, and voltaren gel. Also treated with Chronic opioids managed by the pain clinic. No change in regimen today. Refills sent.  - pregabalin (LYRICA) 150 MG capsule; Take 1 capsule (150 mg total) by mouth 3 (three) times daily.  Dispense: 90 capsule; Refill: 5  10. Migraine syndrome History of migraines previously managed with Ajovy and Ubrelvy. Unfortunately insurance is not covering these medications. Requesting a referral to Neurology at [facility] to reestablish there since they have relocated. Referral placed.  - Ambulatory referral to Neurology  11. Well woman care Referring to OBGYN per patient request.   Procedures performed this visit: None.  Return in about 6 months (around 12/29/2022) for chronic disease follow up.  __________________________________ Thayer Ohm, DNP, APRN, FNP-BC Primary Care and Sports Medicine [facility]

## 2022-06-29 LAB — CBC WITH DIFFERENTIAL/PLATELET
Absolute Monocytes: 462 cells/uL (ref 200–950)
Basophils Absolute: 27 cells/uL (ref 0–200)
Basophils Relative: 0.4 %
Eosinophils Absolute: 40 cells/uL (ref 15–500)
Eosinophils Relative: 0.6 %
HCT: 38.8 % (ref 35.0–45.0)
Hemoglobin: 13.3 g/dL (ref 11.7–15.5)
Lymphs Abs: 2352 cells/uL (ref 850–3900)
MCH: 31.6 pg (ref 27.0–33.0)
MCHC: 34.3 g/dL (ref 32.0–36.0)
MCV: 92.2 fL (ref 80.0–100.0)
MPV: 11 fL (ref 7.5–12.5)
Monocytes Relative: 6.9 %
Neutro Abs: 3819 cells/uL (ref 1500–7800)
Neutrophils Relative %: 57 %
Platelets: 302 10*3/uL (ref 140–400)
RBC: 4.21 10*6/uL (ref 3.80–5.10)
RDW: 12.8 % (ref 11.0–15.0)
Total Lymphocyte: 35.1 %
WBC: 6.7 10*3/uL (ref 3.8–10.8)

## 2022-06-29 LAB — HEMOGLOBIN A1C
Hgb A1c MFr Bld: 4.8 % of total Hgb (ref <5.7)
Mean Plasma Glucose: 91 mg/dL
eAG (mmol/L): 5 mmol/L

## 2022-06-29 LAB — COMPLETE METABOLIC PANEL WITH GFR
AG Ratio: 1.4 (calc) (ref 1.0–2.5)
ALT: 13 U/L (ref 6–29)
AST: 12 U/L (ref 10–30)
Albumin: 4.1 g/dL (ref 3.6–5.1)
Alkaline phosphatase (APISO): 58 U/L (ref 31–125)
BUN: 16 mg/dL (ref 7–25)
CO2: 25 mmol/L (ref 20–32)
Calcium: 9.2 mg/dL (ref 8.6–10.2)
Chloride: 104 mmol/L (ref 98–110)
Creat: 0.77 mg/dL (ref 0.50–0.97)
Globulin: 3 g/dL (calc) (ref 1.9–3.7)
Glucose, Bld: 80 mg/dL (ref 65–139)
Potassium: 4.3 mmol/L (ref 3.5–5.3)
Sodium: 138 mmol/L (ref 135–146)
Total Bilirubin: 0.4 mg/dL (ref 0.2–1.2)
Total Protein: 7.1 g/dL (ref 6.1–8.1)
eGFR: 104 mL/min/{1.73_m2} (ref >=60)

## 2022-06-29 LAB — LIPID PANEL
Cholesterol: 215 mg/dL — ABNORMAL HIGH (ref <200)
HDL: 62 mg/dL (ref >=50)
LDL Cholesterol (Calc): 117 mg/dL (calc) — ABNORMAL HIGH
Non-HDL Cholesterol (Calc): 153 mg/dL (calc) — ABNORMAL HIGH (ref <130)
Total CHOL/HDL Ratio: 3.5 (calc) (ref <5.0)
Triglycerides: 251 mg/dL — ABNORMAL HIGH (ref <150)

## 2022-06-29 LAB — TSH: TSH: 2.01 mIU/L

## 2022-07-12 ENCOUNTER — Telehealth: Payer: Medicaid Other | Admitting: Physician Assistant

## 2022-07-12 DIAGNOSIS — M5030 Other cervical disc degeneration, unspecified cervical region: Secondary | ICD-10-CM

## 2022-07-12 DIAGNOSIS — M5412 Radiculopathy, cervical region: Secondary | ICD-10-CM

## 2022-07-12 NOTE — Progress Notes (Signed)
Virtual Visit Consent   Beth Lane, you are scheduled for a virtual visit with a [name] provider today. Just as with appointments in the office, your consent must be obtained to participate. Your consent will be active for this visit and any virtual visit you may have with one of our providers in the next 365 days. If you have a MyChart account, a copy of this consent can be sent to you electronically.  As this is a virtual visit, video technology does not allow for your provider to perform a traditional examination. This may limit your provider's ability to fully assess your condition. If your provider identifies any concerns that need to be evaluated in person or the need to arrange testing (such as labs, EKG, etc.), we will make arrangements to do so. Although advances in technology are sophisticated, we cannot ensure that it will always work on either your end or our end. If the connection with a video visit is poor, the visit may have to be switched to a telephone visit. With either a video or telephone visit, we are not always able to ensure that we have a secure connection.  By engaging in this virtual visit, you consent to the provision of healthcare and authorize for your insurance to be billed (if applicable) for the services provided during this visit. Depending on your insurance coverage, you may receive a charge related to this service.  I need to obtain your verbal consent now. Are you willing to proceed with your visit today? [name] has provided verbal consent on 07/12/2022 for a virtual visit (video or telephone). [name], PA-C  Date: 07/12/2022 2:21 PM  Virtual Visit via Video Note   I, [name], connected with  Beth Lane  ([id number], 11-02-1985) on 07/12/22 at  2:15 PM EDT by a video-enabled telemedicine application and verified that I am speaking with the correct person using two  identifiers.  Location: Patient: Virtual Visit Location Patient: Home Provider: Virtual Visit Location Provider: Home Office   I discussed the limitations of evaluation and management by telemedicine and the availability of in person appointments. The patient expressed understanding and agreed to proceed.    History of Present Illness: Beth Lane is a 37 y.o. who identifies as a female who was assigned female at birth, and is being seen today for neck pain with symptoms radiating down left arm.  HPI: Neck Pain  This is a recurrent problem. The current episode started in the past 7 days (3 days). The problem occurs constantly. The problem has been gradually worsening. The pain is associated with an unknown (DDD) factor. The pain is present in the left side. The quality of the pain is described as shooting and stabbing. The pain is severe. The symptoms are aggravated by position (movements). The pain is Same all the time. Stiffness is present All day. Associated symptoms include numbness, tingling and weakness. Pertinent negatives include no chest pain, headaches, paresis, photophobia, syncope, trouble swallowing or visual change. She has tried NSAIDs, acetaminophen, bed rest and muscle relaxants (On Robaxin 750mg  TID at baseline, Oxycodone 10mg  IR q 6 hrs, and Lyrica 150mg  TID) for the symptoms. The treatment provided no relief.    Has had before but this is worst it has ever been.  Problems:  Patient Active Problem List   Diagnosis Date Noted   Empty sella syndrome (HCC) 02/16/2022   Abnormal laboratory test 07/20/2021   Weakness 07/14/2021   Gait  abnormality 07/14/2021   Chronic migraine w/o aura w/o status migrainosus, not intractable 07/14/2021   Morbid obesity (HCC) 07/14/2021   Sinus tachycardia 07/09/2021   Hospital discharge follow-up 07/09/2021   Chronic left hip pain 06/17/2021   DDD (degenerative disc disease), cervical 04/21/2021   Migraine syndrome 03/16/2021    Asthma 03/08/2021   Chronic migraine without aura without status migrainosus, not intractable 05/26/2017   Major depressive disorder 08/06/2016   Panic disorder 03/13/2015   Narcolepsy 03/13/2015   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   Chronic post-traumatic stress disorder (PTSD) 12/16/2009   Fibromyalgia affecting multiple sites 10/31/2006   OCD (obsessive compulsive disorder) 04/10/2006   Generalized anxiety disorder with panic attacks 04/10/2006   Cardiac arrhythmia 03/17/2006    Allergies:  Allergies  Allergen Reactions   Sumatriptan Anaphylaxis   Barium Sulfate Nausea And Vomiting   Tape Rash   Medications:  Current Outpatient Medications:    Aluminum & Magnesium Hydroxide (MAGNESIUM-ALUMINUM PO), Take by mouth., Disp: , Rfl:    blood glucose meter kit and supplies KIT, Dispense based on patient and insurance preference. Use up to four times daily as directed. Please include lancets, test strips, control solution., Disp: 1 each, Rfl: 0   diclofenac Sodium (VOLTAREN) 1 % GEL, Apply 2 Grams topically to affected areas 3 times daily as needed, Disp: 50 g, Rfl: 3   esomeprazole (NEXIUM) 40 MG capsule, TAKE ONE CAPSULE BY MOUTH DAILY AT NOON, Disp: 90 capsule, Rfl: 1   fluticasone-salmeterol (ADVAIR DISKUS) 250-50 MCG/ACT AEPB, Inhale 1 puff into the lungs 2 (two) times daily. in the morning and at bedtime., Disp: 180 each, Rfl: 1   Fremanezumab-vfrm (AJOVY) 225 MG/1.5ML SOSY, INJECT 225 MG UNDER THE SKIN EVERY 30 DAYS, Disp: 1.5 mL, Rfl: 2   Lido-Capsaicin-Men-Methyl Sal (1ST MEDX-PATCH/ LIDOCAINE) 4-0.025-5-20 % PTCH, Apply topically as instructed., Disp: 10 patch, Rfl: 3   lidocaine (LIDODERM) 5 %, Place 1 patch onto the skin daily., Disp: , Rfl:    lidocaine-prilocaine (EMLA) cream, Apply topically 3 (three) times daily as needed. SMARTSIG:2 Gram(s) Topical 3 Times Daily PRN Strength: 2.5-2.5 %, Disp: 30 g, Rfl: 3   linaCLOtide (LINZESS PO), Take by mouth., Disp: , Rfl:     methocarbamol (ROBAXIN) 750 MG tablet, Take 1 tablet (750 mg total) by mouth every 6 (six) hours as needed., Disp: 360 tablet, Rfl: 3   Oxycodone HCl 10 MG TABS, Take 10 mg by mouth every 6 (six) hours as needed., Disp: , Rfl:    PARoxetine (PAXIL) 40 MG tablet, TAKE 1 TABLET(40 MG) BY MOUTH DAILY, Disp: 90 tablet, Rfl: 1   prazosin (MINIPRESS) 1 MG capsule, TAKE 3 CAPSULES(3 MG) BY MOUTH AT BEDTIME, Disp: 270 capsule, Rfl: 1   pregabalin (LYRICA) 150 MG capsule, Take 1 capsule (150 mg total) by mouth 3 (three) times daily., Disp: 90 capsule, Rfl: 5   Ubrogepant (UBRELVY) 50 MG TABS, TAKE 1 TABLET BY MOUTH AS NEEDED FOR MIGRAINE, Disp: 10 tablet, Rfl: 3   VYVANSE 40 MG capsule, Take 1 capsule (40 mg total) by mouth every morning. NO FURTHER REFILLS UNTIL IN PERSON APPT, Disp: 30 capsule, Rfl: 0  Observations/Objective: Patient is well-developed, well-nourished in no acute distress.  Resting comfortably at home.  Head is normocephalic, atraumatic.  No labored breathing.  Speech is clear and coherent with logical content.  Patient is alert and oriented at baseline.    Assessment and Plan: 1. DDD (degenerative disc disease), cervical  2. Cervical radiculopathy  - Acute on  chronic issue worsening despite chronic management with Robaxin, Lyrica, and Oxycodone - Advised to seek in person evaluation since causing weakness in left arm and hand with stabbing pains and numbness with any movements  Follow Up Instructions: I discussed the assessment and treatment plan with the patient. The patient was provided an opportunity to ask questions and all were answered. The patient agreed with the plan and demonstrated an understanding of the instructions.  A copy of instructions were sent to the patient via MyChart unless otherwise noted below.    The patient was advised to call back or seek an in-person evaluation if the symptoms worsen or if the condition fails to improve as anticipated.  Time:  I  spent 10 minutes with the patient via telehealth technology discussing the above problems/concerns.    [name], PA-C

## 2022-07-12 NOTE — Addendum Note (Signed)
Addended by: [name] on: 07/12/2022 02:23 PM   Modules accepted: Level of Service

## 2022-07-12 NOTE — Patient Instructions (Signed)
Zuriel Lennox Lane, thank you for joining [name], PA-C for today's virtual visit.  While this provider is not your primary care provider (PCP), if your PCP is located in our provider database this encounter information will be shared with them immediately following your visit.   A [facility] MyChart account gives you access to today's visit and all your visits, tests, and labs performed at [facility] " click here if you don't have a [facility] MyChart account or go to [email or web address]  Consent: (Patient) Beth Lane provided verbal consent for this virtual visit at the beginning of the encounter.  Current Medications:  Current Outpatient Medications:    Aluminum & Magnesium Hydroxide (MAGNESIUM-ALUMINUM PO), Take by mouth., Disp: , Rfl:    blood glucose meter kit and supplies KIT, Dispense based on patient and insurance preference. Use up to four times daily as directed. Please include lancets, test strips, control solution., Disp: 1 each, Rfl: 0   diclofenac Sodium (VOLTAREN) 1 % GEL, Apply 2 Grams topically to affected areas 3 times daily as needed, Disp: 50 g, Rfl: 3   esomeprazole (NEXIUM) 40 MG capsule, TAKE ONE CAPSULE BY MOUTH DAILY AT NOON, Disp: 90 capsule, Rfl: 1   fluticasone-salmeterol (ADVAIR DISKUS) 250-50 MCG/ACT AEPB, Inhale 1 puff into the lungs 2 (two) times daily. in the morning and at bedtime., Disp: 180 each, Rfl: 1   Fremanezumab-vfrm (AJOVY) 225 MG/1.5ML SOSY, INJECT 225 MG UNDER THE SKIN EVERY 30 DAYS, Disp: 1.5 mL, Rfl: 2   Lido-Capsaicin-Men-Methyl Sal (1ST MEDX-PATCH/ LIDOCAINE) 4-0.025-5-20 % PTCH, Apply topically as instructed., Disp: 10 patch, Rfl: 3   lidocaine (LIDODERM) 5 %, Place 1 patch onto the skin daily., Disp: , Rfl:    lidocaine-prilocaine (EMLA) cream, Apply topically 3 (three) times daily as needed. SMARTSIG:2 Gram(s) Topical 3 Times Daily PRN Strength: 2.5-2.5 %, Disp: 30 g, Rfl: 3    linaCLOtide (LINZESS PO), Take by mouth., Disp: , Rfl:    methocarbamol (ROBAXIN) 750 MG tablet, Take 1 tablet (750 mg total) by mouth every 6 (six) hours as needed., Disp: 360 tablet, Rfl: 3   Oxycodone HCl 10 MG TABS, Take 10 mg by mouth every 6 (six) hours as needed., Disp: , Rfl:    PARoxetine (PAXIL) 40 MG tablet, TAKE 1 TABLET(40 MG) BY MOUTH DAILY, Disp: 90 tablet, Rfl: 1   prazosin (MINIPRESS) 1 MG capsule, TAKE 3 CAPSULES(3 MG) BY MOUTH AT BEDTIME, Disp: 270 capsule, Rfl: 1   pregabalin (LYRICA) 150 MG capsule, Take 1 capsule (150 mg total) by mouth 3 (three) times daily., Disp: 90 capsule, Rfl: 5   Ubrogepant (UBRELVY) 50 MG TABS, TAKE 1 TABLET BY MOUTH AS NEEDED FOR MIGRAINE, Disp: 10 tablet, Rfl: 3   VYVANSE 40 MG capsule, Take 1 capsule (40 mg total) by mouth every morning. NO FURTHER REFILLS UNTIL IN PERSON APPT, Disp: 30 capsule, Rfl: 0   Medications ordered in this encounter:  No orders of the defined types were placed in this encounter.    *If you need refills on other medications prior to your next appointment, please contact your pharmacy*  Follow-Up: Call back or seek an in-person evaluation if the symptoms worsen or if the condition fails to improve as anticipated.  [facility] [phone]  Other Instructions Seek in person management ASAP   If you have been instructed to have an in-person evaluation today at a local Urgent Care facility, please use the link below. It will take you  to a list of all of our available [facility], including address, phone number and hours of operation. Please do not delay care.  [facility]  If you or a family member do not have a primary care provider, use the link below to schedule a visit and establish care. When you choose a [facility] primary care physician or advanced practice provider, you gain a long-term partner in health. Find a Primary Care Provider  Learn more about [facility]'s  in-office and virtual care options: [facility] - Get Care Now

## 2022-07-13 ENCOUNTER — Encounter: Payer: Self-pay | Admitting: Medical-Surgical

## 2022-07-14 ENCOUNTER — Ambulatory Visit: Payer: Medicaid Other | Admitting: "Endocrinology

## 2022-08-13 ENCOUNTER — Encounter: Payer: Self-pay | Admitting: Medical-Surgical

## 2022-08-13 DIAGNOSIS — M797 Fibromyalgia: Secondary | ICD-10-CM

## 2022-08-13 MED ORDER — METHOCARBAMOL 750 MG PO TABS: 750.0000 mg | ORAL_TABLET | Freq: Four times a day (QID) | ORAL | 3 refills | Status: DC | PRN

## 2022-08-13 NOTE — Telephone Encounter (Signed)
I have not seen this patient in over a year, I probably should not but I think Ander Slade has seen her more recently and may be willing to do the prescription.

## 2022-08-16 ENCOUNTER — Other Ambulatory Visit: Payer: Self-pay

## 2022-08-16 DIAGNOSIS — M797 Fibromyalgia: Secondary | ICD-10-CM

## 2022-08-16 MED ORDER — METHOCARBAMOL 750 MG PO TABS
750.0000 mg | ORAL_TABLET | Freq: Four times a day (QID) | ORAL | 3 refills | Status: DC | PRN
Start: 2022-08-16 — End: 2023-08-31

## 2022-08-31 ENCOUNTER — Ambulatory Visit: Payer: Medicaid Other | Admitting: "Endocrinology

## 2022-08-31 LAB — COMPREHENSIVE METABOLIC PANEL
ALT: 22 IU/L (ref 0–32)
AST: 26 IU/L (ref 0–40)
Albumin: 3.8 g/dL — ABNORMAL LOW (ref 3.9–4.9)
Alkaline Phosphatase: 66 IU/L (ref 44–121)
BUN/Creatinine Ratio: 15 (ref 9–23)
BUN: 10 mg/dL (ref 6–20)
Bilirubin Total: 0.4 mg/dL (ref 0.0–1.2)
CO2: 24 mmol/L (ref 20–29)
Calcium: 9.1 mg/dL (ref 8.7–10.2)
Chloride: 104 mmol/L (ref 96–106)
Creatinine, Ser: 0.66 mg/dL (ref 0.57–1.00)
Globulin, Total: 2.7 g/dL (ref 1.5–4.5)
Glucose: 93 mg/dL (ref 70–99)
Potassium: 4.7 mmol/L (ref 3.5–5.2)
Sodium: 139 mmol/L (ref 134–144)
Total Protein: 6.5 g/dL (ref 6.0–8.5)
eGFR: 118 mL/min/{1.73_m2} (ref >59)

## 2022-08-31 LAB — TSH: TSH: 1.29 u[IU]/mL (ref 0.450–4.500)

## 2022-08-31 LAB — CORTISOL-AM, BLOOD: Cortisol - AM: 8.3 ug/dL (ref 6.2–19.4)

## 2022-08-31 LAB — PROLACTIN: Prolactin: 12.3 ng/mL (ref 4.8–33.4)

## 2022-08-31 LAB — T4, FREE: Free T4: 1.12 ng/dL (ref 0.82–1.77)

## 2022-09-10 ENCOUNTER — Encounter: Payer: Self-pay | Admitting: Medical-Surgical

## 2022-09-13 ENCOUNTER — Ambulatory Visit: Payer: MEDICAID | Admitting: "Endocrinology

## 2022-09-22 ENCOUNTER — Encounter: Payer: Self-pay | Admitting: Medical-Surgical

## 2022-10-04 ENCOUNTER — Telehealth: Payer: Self-pay | Admitting: General Practice

## 2022-10-04 NOTE — Transitions of Care (Post Inpatient/ED Visit) (Signed)
   10/04/2022  Name: Beth Lane MRN: [id number] DOB: 1985-03-22  Today's TOC FU Call Status: Today's TOC FU Call Status:: Unsuccessful Call (1st Attempt) Unsuccessful Call (1st Attempt) Date: 10/04/22  Attempted to reach the patient regarding the most recent Inpatient/ED visit.  Follow Up Plan: Additional outreach attempts will be made to reach the patient to complete the Transitions of Care (Post Inpatient/ED visit) call.   Signature [place] Charon, Control and instrumentation engineer

## 2022-10-05 NOTE — Transitions of Care (Post Inpatient/ED Visit) (Signed)
   10/05/2022  Name: Beth Lane MRN: [id number] DOB: [birth year]  Today's TOC FU Call Status: Today's TOC FU Call Status:: Unsuccessful Call (2nd Attempt) Unsuccessful Call (1st Attempt) Date: 10/04/22 Unsuccessful Call (2nd Attempt) Date: 10/05/22  Attempted to reach the patient regarding the most recent Inpatient/ED visit.  Follow Up Plan: Additional outreach attempts will be made to reach the patient to complete the Transitions of Care (Post Inpatient/ED visit) call.   Signature [place] Charon, Control and instrumentation engineer

## 2022-10-06 NOTE — Transitions of Care (Post Inpatient/ED Visit) (Signed)
   10/06/2022  Name: Beth Lane MRN: [id number] DOB: Aug 09, 1985  Today's TOC FU Call Status: Today's TOC FU Call Status:: Unsuccessful Call (3rd Attempt) Unsuccessful Call (1st Attempt) Date: 10/04/22 Unsuccessful Call (2nd Attempt) Date: 10/05/22 Unsuccessful Call (3rd Attempt) Date: 10/06/22  Attempted to reach the patient regarding the most recent Inpatient/ED visit.  Follow Up Plan: No further outreach attempts will be made at this time. We have been unable to contact the patient.  Signature [place] Charon, Control and instrumentation engineer

## 2022-11-07 ENCOUNTER — Telehealth: Payer: MEDICAID | Admitting: Family

## 2022-11-07 DIAGNOSIS — R8290 Unspecified abnormal findings in urine: Secondary | ICD-10-CM | POA: Diagnosis not present

## 2022-11-07 DIAGNOSIS — N390 Urinary tract infection, site not specified: Secondary | ICD-10-CM | POA: Diagnosis not present

## 2022-11-07 DIAGNOSIS — R531 Weakness: Secondary | ICD-10-CM | POA: Diagnosis not present

## 2022-11-07 DIAGNOSIS — R1012 Left upper quadrant pain: Secondary | ICD-10-CM | POA: Diagnosis not present

## 2022-11-07 DIAGNOSIS — R110 Nausea: Secondary | ICD-10-CM

## 2022-11-07 NOTE — Progress Notes (Signed)
Virtual Visit Consent   Beth Lane, you are scheduled for a virtual visit with a  provider today. Just as with appointments in the office, your consent must be obtained to participate. Your consent will be active for this visit and any virtual visit you may have with one of our providers in the next 365 days. If you have a MyChart account, a copy of this consent can be sent to you electronically.  As this is a virtual visit, video technology does not allow for your provider to perform a traditional examination. This may limit your provider's ability to fully assess your condition. If your provider identifies any concerns that need to be evaluated in person or the need to arrange testing (such as labs, EKG, etc.), we will make arrangements to do so. Although advances in technology are sophisticated, we cannot ensure that it will always work on either your end or our end. If the connection with a video visit is poor, the visit may have to be switched to a telephone visit. With either a video or telephone visit, we are not always able to ensure that we have a secure connection.  By engaging in this virtual visit, you consent to the provision of healthcare and authorize for your insurance to be billed (if applicable) for the services provided during this visit. Depending on your insurance coverage, you may receive a charge related to this service.  I need to obtain your verbal consent now. Are you willing to proceed with your visit today? Beth Lane has provided verbal consent on 11/07/2022 for a virtual visit (video or telephone). Beth Rodney, FNP  Date: 11/07/2022 4:26 PM  Virtual Visit via Video Note   I, Beth Lane, connected with  Beth Lane  ([id number], [birth year]) on 11/07/22 at  4:15 PM EDT by a video-enabled telemedicine application and verified that I am speaking with the correct person using two identifiers.  Location: Patient:  Virtual Visit Location Patient: Home Provider: Virtual Visit Location Provider: Home Office   I discussed the limitations of evaluation and management by telemedicine and the availability of in person appointments. The patient expressed understanding and agreed to proceed.    History of Present Illness: Beth Lane is a 37 y.o. who identifies as a female who was assigned female at birth, and is being seen today for nausea, weakness, abdominal pain in LUQ that started Wednesday and has worsen. She had two negative COVID tests. She was treated for a UTI last month.   HPI: Abdominal Pain This is a new problem. The current episode started yesterday. The pain is located in the LUQ. The pain is at a severity of 8/10. The pain is moderate. The quality of the pain is sharp (stabbing). Associated symptoms include belching, diarrhea, frequency and nausea. Pertinent negatives include no dysuria, fever, flatus, hematuria or vomiting. Associated symptoms comments: Darker urine, foul urine .    Problems:  Patient Active Problem List   Diagnosis Date Noted   Empty sella syndrome (HCC) 02/16/2022   Abnormal laboratory test 07/20/2021   Weakness 07/14/2021   Gait abnormality 07/14/2021   Chronic migraine w/o aura w/o status migrainosus, not intractable 07/14/2021   Morbid obesity (HCC) 07/14/2021   Sinus tachycardia 07/09/2021   Hospital discharge follow-up 07/09/2021   Chronic left hip pain 06/17/2021   DDD (degenerative disc disease), cervical 04/21/2021   Migraine syndrome 03/16/2021   Asthma 03/08/2021   Chronic migraine without aura without status migrainosus, not intractable  05/26/2017   Major depressive disorder 08/06/2016   Panic disorder 03/13/2015   Narcolepsy 03/13/2015   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   Chronic post-traumatic stress disorder (PTSD) 12/16/2009   Fibromyalgia affecting multiple sites 10/31/2006   OCD (obsessive compulsive disorder) 04/10/2006   Generalized  anxiety disorder with panic attacks 04/10/2006   Cardiac arrhythmia 03/17/2006    Allergies:  Allergies  Allergen Reactions   Sumatriptan Anaphylaxis   Barium Sulfate Nausea And Vomiting   Tape Rash   Medications:  Current Outpatient Medications:    Aluminum & Magnesium Hydroxide (MAGNESIUM-ALUMINUM PO), Take by mouth., Disp: , Rfl:    blood glucose meter kit and supplies KIT, Dispense based on patient and insurance preference. Use up to four times daily as directed. Please include lancets, test strips, control solution., Disp: 1 each, Rfl: 0   diclofenac Sodium (VOLTAREN) 1 % GEL, Apply 2 Grams topically to affected areas 3 times daily as needed, Disp: 50 g, Rfl: 3   esomeprazole (NEXIUM) 40 MG capsule, TAKE ONE CAPSULE BY MOUTH DAILY AT NOON, Disp: 90 capsule, Rfl: 1   fluticasone-salmeterol (ADVAIR DISKUS) 250-50 MCG/ACT AEPB, Inhale 1 puff into the lungs 2 (two) times daily. in the morning and at bedtime., Disp: 180 each, Rfl: 1   Fremanezumab-vfrm (AJOVY) 225 MG/1.5ML SOSY, INJECT 225 MG UNDER THE SKIN EVERY 30 DAYS, Disp: 1.5 mL, Rfl: 2   Lido-Capsaicin-Men-Methyl Sal (1ST MEDX-PATCH/ LIDOCAINE) 4-0.025-5-20 % PTCH, Apply topically as instructed., Disp: 10 patch, Rfl: 3   lidocaine (LIDODERM) 5 %, Place 1 patch onto the skin daily., Disp: , Rfl:    lidocaine-prilocaine (EMLA) cream, Apply topically 3 (three) times daily as needed. SMARTSIG:2 Gram(s) Topical 3 Times Daily PRN Strength: 2.5-2.5 %, Disp: 30 g, Rfl: 3   linaCLOtide (LINZESS PO), Take by mouth., Disp: , Rfl:    methocarbamol (ROBAXIN) 750 MG tablet, Take 1 tablet (750 mg total) by mouth every 6 (six) hours as needed., Disp: 360 tablet, Rfl: 3   Oxycodone HCl 10 MG TABS, Take 10 mg by mouth every 6 (six) hours as needed., Disp: , Rfl:    PARoxetine (PAXIL) 40 MG tablet, TAKE 1 TABLET(40 MG) BY MOUTH DAILY, Disp: 90 tablet, Rfl: 1   prazosin (MINIPRESS) 1 MG capsule, TAKE 3 CAPSULES(3 MG) BY MOUTH AT BEDTIME, Disp: 270  capsule, Rfl: 1   pregabalin (LYRICA) 150 MG capsule, Take 1 capsule (150 mg total) by mouth 3 (three) times daily., Disp: 90 capsule, Rfl: 5   Ubrogepant (UBRELVY) 50 MG TABS, TAKE 1 TABLET BY MOUTH AS NEEDED FOR MIGRAINE, Disp: 10 tablet, Rfl: 3   VYVANSE 40 MG capsule, Take 1 capsule (40 mg total) by mouth every morning. NO FURTHER REFILLS UNTIL IN PERSON APPT, Disp: 30 capsule, Rfl: 0  Observations/Objective: Patient is well-developed, well-nourished in no acute distress.  Resting comfortably  at home.  Head is normocephalic, atraumatic.  No labored breathing.  Speech is clear and coherent with logical content.  Patient is alert and oriented at baseline.  Weakness   Assessment and Plan: 1. Recurrent UTI  2. Left upper quadrant abdominal pain  3. Weakness  4. Foul smelling urine  5. Nausea  Given current symptoms recommend her being seen in person today. Worrisome for UTI and sepsis.  Force fluids Red flags discussed to go ED  Follow Up Instructions: I discussed the assessment and treatment plan with the patient. The patient was provided an opportunity to ask questions and all were answered. The patient agreed with the  plan and demonstrated an understanding of the instructions.  A copy of instructions were sent to the patient via MyChart unless otherwise noted below.    The patient was advised to call back or seek an in-person evaluation if the symptoms worsen or if the condition fails to improve as anticipated.  Time:  I spent 11 minutes with the patient via telehealth technology discussing the above problems/concerns.    Beth Rodney, FNP

## 2022-11-08 ENCOUNTER — Telehealth: Payer: Self-pay | Admitting: General Practice

## 2022-11-08 NOTE — Transitions of Care (Post Inpatient/ED Visit) (Signed)
11/08/2022  Name: Rhiannon Brien MRN: [id number] DOB: [birth year]  Today's TOC FU Call Status: Today's TOC FU Call Status:: Unsuccessful Call (1st Attempt) Unsuccessful Call (1st Attempt) Date: 11/08/22  Attempted to reach the patient regarding the most recent Inpatient/ED visit.  Follow Up Plan: Additional outreach attempts will be made to reach the patient to complete the Transitions of Care (Post Inpatient/ED visit) call.   Signature [place] Charon, Control and instrumentation engineer

## 2022-11-09 NOTE — Transitions of Care (Post Inpatient/ED Visit) (Signed)
11/09/2022  Name: Beth Lane MRN: [id number] DOB: 1985/11/04  Today's TOC FU Call Status: Today's TOC FU Call Status:: Unsuccessful Call (2nd Attempt) Unsuccessful Call (1st Attempt) Date: 11/08/22 Unsuccessful Call (2nd Attempt) Date: 11/09/22  Attempted to reach the patient regarding the most recent Inpatient/ED visit.  Follow Up Plan: Additional outreach attempts will be made to reach the patient to complete the Transitions of Care (Post Inpatient/ED visit) call.   Signature [place] Charon, Control and instrumentation engineer

## 2022-11-10 NOTE — Transitions of Care (Post Inpatient/ED Visit) (Signed)
11/10/2022  Name: Beth Lane MRN: [id number] DOB: 01/26/1986  Today's TOC FU Call Status: Today's TOC FU Call Status:: Unsuccessful Call (2nd Attempt) Unsuccessful Call (1st Attempt) Date: 11/08/22 Unsuccessful Call (2nd Attempt) Date: 11/09/22 Unsuccessful Call (3rd Attempt) Date: 11/10/22  Attempted to reach the patient regarding the most recent Inpatient/ED visit.  Follow Up Plan: No further outreach attempts will be made at this time. We have been unable to contact the patient.  Signature [place] Charon, Control and instrumentation engineer

## 2022-11-11 ENCOUNTER — Encounter: Payer: Self-pay | Admitting: Medical-Surgical

## 2022-12-09 ENCOUNTER — Encounter: Payer: Self-pay | Admitting: Medical-Surgical

## 2022-12-09 ENCOUNTER — Encounter: Payer: Self-pay | Admitting: "Endocrinology

## 2022-12-09 ENCOUNTER — Encounter: Payer: Medicaid Other | Admitting: Medical-Surgical

## 2022-12-09 ENCOUNTER — Ambulatory Visit: Payer: MEDICAID | Admitting: "Endocrinology

## 2022-12-09 VITALS — BP 124/82 | HR 92 | Ht 65.0 in | Wt 250.4 lb

## 2022-12-09 DIAGNOSIS — E782 Mixed hyperlipidemia: Secondary | ICD-10-CM | POA: Diagnosis not present

## 2022-12-09 DIAGNOSIS — E236 Other disorders of pituitary gland: Secondary | ICD-10-CM | POA: Diagnosis not present

## 2022-12-09 HISTORY — DX: Mixed hyperlipidemia: E78.2

## 2022-12-09 MED ORDER — ROSUVASTATIN CALCIUM 5 MG PO TABS: 5.0000 mg | ORAL_TABLET | Freq: Every day | ORAL | 1 refills | Status: DC

## 2022-12-09 NOTE — Patient Instructions (Signed)
                                     Advice for Weight Management  -For most of us the best way to lose weight is by diet management. Generally speaking, diet management means consuming less calories intentionally which over time brings about progressive weight loss.  This can be achieved more effectively by avoiding ultra processed carbohydrates, processed meats, unhealthy fats.    It is critically important to know your numbers: how much calorie you are consuming and how much calorie you need. More importantly, our carbohydrates sources should be unprocessed naturally occurring  complex starch food items.  It is always important to balance nutrition also by  appropriate intake of proteins (mainly plant-based), healthy fats/oils, plenty of fruits and vegetables.   -The American College of Lifestyle Medicine (ACL M) recommends nutrition derived mostly from Whole Food, Plant Predominant Sources example an apple instead of applesauce or apple pie. Eat Plenty of vegetables, Mushrooms, fruits, Legumes, Whole Grains, Nuts, seeds in lieu of processed meats, processed snacks/pastries red meat, poultry, eggs.  Use only water or unsweetened tea for hydration.  The College also recommends the need to stay away from risky substances including alcohol, smoking; obtaining 7-9 hours of restorative sleep, at least 150 minutes of moderate intensity exercise weekly, importance of healthy social connections, and being mindful of stress and seek help when it is overwhelming.    -Sticking to a routine mealtime to eat 3 meals a day and avoiding unnecessary snacks is shown to have a big role in weight control. Under normal circumstances, the only time we burn stored energy is when we are hungry, so allow  some hunger to take place- hunger means no food between appropriate meal times, only water.  It is not advisable to starve.   -It is better to avoid simple carbohydrates including:  Cakes, Sweet Desserts, Ice Cream, Soda (diet and regular), Sweet Tea, Candies, Chips, Cookies, Store Bought Juices, Alcohol in Excess of  1-2 drinks a day, Lemonade,  Artificial Sweeteners, Doughnuts, Coffee Creamers, "Sugar-free" Products, etc, etc.  This is not a complete list.....    -Consulting with certified diabetes educators is proven to provide you with the most accurate and current information on diet.  Also, you may be  interested in discussing diet options/exchanges , we can schedule a visit with Beth Lane, RDN, CDE for individualized nutrition education.  -Exercise: If you are able: 30 -60 minutes a day ,4 days a week, or 150 minutes of moderate intensity exercise weekly.    The longer the better if tolerated.  Combine stretch, strength, and aerobic activities.  If you were told in the past that you have high risk for cardiovascular diseases, or if you are currently symptomatic, you may seek evaluation by your heart doctor prior to initiating moderate to intense exercise programs.                                  Additional Care Considerations for Diabetes/Prediabetes   -Diabetes  is a chronic disease.  The most important care consideration is regular follow-up with your diabetes care provider with the goal being avoiding or delaying its complications and to take advantage of advances in medications and technology.  If appropriate actions are taken early enough, type 2 diabetes can even be   reversed.  Seek information from the right source.  - Whole Food, Plant Predominant Nutrition is highly recommended: Eat Plenty of vegetables, Mushrooms, fruits, Legumes, Whole Grains, Nuts, seeds in lieu of processed meats, processed snacks/pastries red meat, poultry, eggs as recommended by American College of  Lifestyle Medicine (ACLM).  -Type 2 diabetes is known to coexist with other important comorbidities such as high blood pressure and high cholesterol.  It is critical to control not only the  diabetes but also the high blood pressure and high cholesterol to minimize and delay the risk of complications including coronary artery disease, stroke, amputations, blindness, etc.  The good news is that this diet recommendation for type 2 diabetes is also very helpful for managing high cholesterol and high blood blood pressure.  - Studies showed that people with diabetes will benefit from a class of medications known as ACE inhibitors and statins.  Unless there are specific reasons not to be on these medications, the standard of care is to consider getting one from these groups of medications at an optimal doses.  These medications are generally considered safe and proven to help protect the heart and the kidneys.    - People with diabetes are encouraged to initiate and maintain regular follow-up with eye doctors, foot doctors, dentists , and if necessary heart and kidney doctors.     - It is highly recommended that people with diabetes quit smoking or stay away from smoking, and get yearly  flu vaccine and pneumonia vaccine at least every 5 years.  See above for additional recommendations on exercise, sleep, stress management , and healthy social connections.      

## 2022-12-09 NOTE — Progress Notes (Signed)
12/09/2022, 5:05 PM  Endocrinology follow-up note   Subjective:    Patient ID: Beth Lane, female    DOB: 1985/08/21, PCP [name], NP   Past Medical History:  Diagnosis Date   Allergy    Imatrex and plastic tape   Arthritis    In knees, back, wrist, neck   Asthma    At age 37   Chronic pain    Clotting disorder (HCC)    Rectal bleeding at times, hemroids, ulcer   Depression    At age 49 but worse after my son died in 2010-01-06   Fibromyalgia    Generalized anxiety disorder    GERD (gastroesophageal reflux disease)    Hypertension    About 4 years ago   Migraine syndrome    Mixed hyperlipidemia 12/09/2022   Osteoporosis    I tend to fracture bones easily and doctor wanted a bone density test in 01/07/11 but found out I was pregnant and was never followed up with   PTSD (post-traumatic stress disorder)    Scoliosis    Stroke (HCC)    Body reacted as a stroke but not an actual stroke. 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 Lost feeling and control on right side   Ulcer    Past Surgical History:  Procedure Laterality Date   CESAREAN SECTION     CHOLECYSTECTOMY     TUBAL LIGATION     Social History   Socioeconomic History   Marital status: Married    Spouse name: Not on file   Number of children: Not on file   Years of education: Not on file   Highest education level: Some college, no degree  Occupational History   Not on file  Tobacco Use   Smoking status: Former    Types: E-cigarettes   Smokeless tobacco: Never  Vaping Use   Vaping status: Every Day  Substance and Sexual Activity   Alcohol use: Never   Drug use: Never   Sexual activity: Yes    Birth control/protection: Other-see comments    Comment: Lesbian  Other Topics Concern   Not on file  Social History Narrative   Not on file   Social Determinants of Health   Financial Resource Strain: Patient Declined  (06/27/2022)   Overall Financial Resource Strain (CARDIA)    Difficulty of Paying Living Expenses: Patient declined  Food Insecurity: Food Insecurity Present (06/27/2022)   Hunger Vital Sign    Worried About Running Out of Food in the Last Year: Sometimes true    Ran Out of Food in the Last Year: Sometimes true  Transportation Needs: Unmet Transportation Needs (06/27/2022)   PRAPARE - Administrator, Civil Service (Medical): Yes    Lack of Transportation (Non-Medical): No  Physical Activity: Insufficiently Active (06/27/2022)   Exercise Vital Sign    Days of Exercise per Week: 1 day    Minutes of Exercise per Session: 20 min  Stress: Stress Concern Present (06/27/2022)   [employer] of Occupational Health - Occupational Stress Questionnaire    Feeling of Stress : Rather much  Social Connections: Unknown (06/27/2022)   Social Connection and Isolation Panel [NHANES]    Frequency of  Communication with Friends and Family: Never    Frequency of Social Gatherings with Friends and Family: Never    Attends Religious Services: Patient declined    Database administrator or Organizations: No    Attends Engineer, structural: Not on file    Marital Status: Married   Family History  Problem Relation Age of Onset   Hypertension Mother    Cancer Mother    Bipolar disorder Mother    Fibromyalgia Mother    ADD / ADHD Mother    Anxiety disorder Mother    Arthritis Mother    COPD Mother    Depression Mother    Diabetes Mother    Obesity Mother    Stroke Father    Post-traumatic stress disorder Father    Heart attack Father    ADD / ADHD Father    Anxiety disorder Father    Asthma Father    Alcohol abuse Maternal Grandfather    ADD / ADHD Daughter    ADD / ADHD Son    Anxiety disorder Son    ADD / ADHD Brother    ADD / ADHD Sister    Anxiety disorder Son    Intellectual disability Son    Anxiety disorder Son    Hearing loss Son    Learning disabilities Son     Early death Son    Learning disabilities Son    Miscarriages / [place] Sister    Outpatient Encounter Medications as of 12/09/2022  Medication Sig   rosuvastatin (CRESTOR) 5 MG tablet Take 1 tablet (5 mg total) by mouth daily.   Aluminum & Magnesium Hydroxide (MAGNESIUM-ALUMINUM PO) Take by mouth.   blood glucose meter kit and supplies KIT Dispense based on patient and insurance preference. Use up to four times daily as directed. Please include lancets, test strips, control solution.   diclofenac Sodium (VOLTAREN) 1 % GEL Apply 2 Grams topically to affected areas 3 times daily as needed   esomeprazole (NEXIUM) 40 MG capsule TAKE ONE CAPSULE BY MOUTH DAILY AT NOON   fluticasone-salmeterol (ADVAIR DISKUS) 250-50 MCG/ACT AEPB Inhale 1 puff into the lungs 2 (two) times daily. in the morning and at bedtime.   Lido-Capsaicin-Men-Methyl Sal (1ST MEDX-PATCH/ LIDOCAINE) 4-0.025-5-20 % PTCH Apply topically as instructed.   lidocaine (LIDODERM) 5 % Place 1 patch onto the skin daily.   lidocaine-prilocaine (EMLA) cream Apply topically 3 (three) times daily as needed. SMARTSIG:2 Gram(s) Topical 3 Times Daily PRN Strength: 2.5-2.5 %   linaCLOtide (LINZESS PO) Take by mouth.   methocarbamol (ROBAXIN) 750 MG tablet Take 1 tablet (750 mg total) by mouth every 6 (six) hours as needed.   Oxycodone HCl 10 MG TABS Take 10 mg by mouth every 6 (six) hours as needed.   prazosin (MINIPRESS) 1 MG capsule TAKE 3 CAPSULES(3 MG) BY MOUTH AT BEDTIME   pregabalin (LYRICA) 150 MG capsule Take 1 capsule (150 mg total) by mouth 3 (three) times daily.   VYVANSE 40 MG capsule Take 1 capsule (40 mg total) by mouth every morning. NO FURTHER REFILLS UNTIL IN PERSON APPT   [DISCONTINUED] Fremanezumab-vfrm (AJOVY) 225 MG/1.5ML SOSY INJECT 225 MG UNDER THE SKIN EVERY 30 DAYS   [DISCONTINUED] PARoxetine (PAXIL) 40 MG tablet TAKE 1 TABLET(40 MG) BY MOUTH DAILY   [DISCONTINUED] Ubrogepant (UBRELVY) 50 MG TABS TAKE 1 TABLET BY MOUTH  AS NEEDED FOR MIGRAINE   No facility-administered encounter medications on file as of 12/09/2022.   ALLERGIES: Allergies  Allergen Reactions  Sumatriptan Anaphylaxis   Barium Sulfate Nausea And Vomiting   Tape Rash    VACCINATION STATUS: Immunization History  Administered Date(s) Administered   Influenza-Unspecified 01/03/2013, 01/27/2018   MMR 08/08/2009   PPD Test 01/05/2013   Pneumococcal Polysaccharide-23 09/19/2010   Td 09/05/2014   Tdap 09/19/2010, 09/27/2011, 01/03/2013    HPI Beth Lane is 37 y.o. female who presents today with a medical history as above. she is being seen in follow-up after she was seen in consultation for empty sella requested by [name], NP.   Her previous workup did not show any endocrine deficit.  She has no new complaints today. See notes from her previous visit. She was found incidentally to have empty sella on MRI of the brain done in May 2023 when there was a syncopal episode.  This patient has no prior history of endocrine deficits, visual field deficits.  Patient is known to have migraine headaches.  Patient is status post 5 successful pregnancies, currently not taking any more fertility.  In July 2024 labs show a.m. cortisol 8.3, prolactin 12.3, TSH 1.29, free T4 1.12. -Patient presents with some weight loss since last visit, mostly unintended. -Prior to that, she was under progressive weight gain.  Patient is on multiple medications including oxycodone, Robaxin, Nexium, Paxil, Linzess, Lyrica, Advair.  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  Patient walks with a cane mostly due to imbalance. -Her previsit labs show severe dyslipidemia. Patient reports some cognitive problems, depression, disequilibrium, as well as heavy..   Review of Systems  Constitutional: + Progressive weight gain, + fatigue, no subjective hyperthermia, no subjective hypothermia Eyes: no blurry vision, no  xerophthalmia ENT: no sore throat, no nodules palpated in throat, no dysphagia/odynophagia, no hoarseness   Objective:       12/09/2022   10:58 AM 06/28/2022    1:38 PM 06/28/2022   10:38 AM  Vitals with BMI  Height 5\' 5"  5\' 5"  5\' 5"   Weight 250 lbs 6 oz 266 lbs 266 lbs  BMI 41.67 44.26 44.26  Systolic 124 136 960  Diastolic 82 78 73  Pulse 92 92 83    BP 124/82   Pulse 92   Ht 5\' 5"  (1.651 m)   Wt 250 lb 6.4 oz (113.6 kg)   BMI 41.67 kg/m   Wt Readings from Last 3 Encounters:  12/09/22 250 lb 6.4 oz (113.6 kg)  06/28/22 266 lb (120.7 kg)  06/28/22 266 lb (120.7 kg)    Physical Exam  Constitutional:  Body mass index is 41.67 kg/m.,  not in acute distress, normal state of mind Eyes: PERRLA, EOMI, no exophthalmos ENT: moist mucous membranes, no gross thyromegaly, no gross cervical lymphadenopathy   CMP ( most recent) CMP     Component Value Date/Time   NA 139 08/30/2022 0830   K 4.7 08/30/2022 0830   CL 104 08/30/2022 0830   CO2 24 08/30/2022 0830   GLUCOSE 93 08/30/2022 0830   GLUCOSE 80 06/28/2022 1125   BUN 10 08/30/2022 0830   CREATININE 0.66 08/30/2022 0830   CREATININE 0.77 06/28/2022 1125   CALCIUM 9.1 08/30/2022 0830   PROT 6.5 08/30/2022 0830   ALBUMIN 3.8 (L) 08/30/2022 0830   AST 26 08/30/2022 0830   ALT 22 08/30/2022 0830   ALKPHOS 66 08/30/2022 0830   BILITOT 0.4 08/30/2022 0830   GFRNONAA >60 07/06/2021 1112     Diabetic Labs (most recent): Lab Results  Component Value Date   HGBA1C 4.8  06/28/2022   HGBA1C 4.7 (L) 07/14/2021      Lab Results  Component Value Date   TSH 1.290 08/30/2022   TSH 2.01 06/28/2022   TSH 2.170 07/14/2021   FREET4 1.12 08/30/2022     MRI of brain without contrast on Jul 06, 2021 IMPRESSION: Mildly motion degraded exam.   No evidence of acute intracranial abnormality.   Partially empty sella turcica. While this finding often reflects incidental anatomic variation, it can also be associated  with idiopathic intracranial hypertension (pseudotumor cerebri).   Small-volume fluid within the left mastoid air cells.   Lipid Panel     Component Value Date/Time   CHOL 215 (H) 06/28/2022 1125   TRIG 251 (H) 06/28/2022 1125   HDL 62 06/28/2022 1125   CHOLHDL 3.5 06/28/2022 1125   LDLCALC 117 (H) 06/28/2022 1125     Assessment & Plan:   1. Empty sella syndrome (HCC)  2.  Hyperlipidemia  - I have reviewed her new and available  records and clinically evaluated the patient. - Based on these reviews, she has partially empty sella,  however,  there is no evidence of endocrine deficit or mass effect at this time.  She continues to deal with headaches. Her last MRI showed possible fluid accumulation in bilateral mastoid air cells, she may benefit from evaluation by ENT. She will continue to need at least annual assessment of 10 cortisol, thyroid function test, prolactin. -Her major cardiovascular risk seems to be her hyperlipidemia given her history of CVA.  I approached her for treatment of hyperlipidemia with Crestor in light of her recent LDL at 117.  She agrees with this plan.  I discussed and prescribed Crestor 5 mg p.o. nightly.  This medication will be advanced as she tolerates.   In light of her concern about fluctuating body weight, she was offered lifestyle medicine.   - she acknowledges that there is a room for improvement in her food and drink choices. - Suggestion is made for her to avoid simple carbohydrates  from her diet including Cakes, Sweet Desserts, Ice Cream, Soda (diet and regular), Sweet Tea, Candies, Chips, Cookies, Store Bought Juices, Alcohol , Artificial Sweeteners,  Coffee Creamer, and "Sugar-free" Products, Lemonade. This will help patient to have more stable blood glucose profile and potentially avoid unintended weight gain.  The following Lifestyle Medicine recommendations according to American College of Lifestyle Medicine  [facility]) were discussed and and  offered to patient and she  agrees to start the journey:  A. Whole Foods, Plant-Based Nutrition comprising of fruits and vegetables, plant-based proteins, whole-grain carbohydrates was discussed in detail with the patient.   A list for source of those nutrients were also provided to the patient.  Patient will use only water or unsweetened tea for hydration. B.  The need to stay away from risky substances including alcohol, smoking; obtaining 7 to 9 hours of restorative sleep, at least 150 minutes of moderate intensity exercise weekly, the importance of healthy social connections,  and stress management techniques were discussed. C.  A full color page of  Calorie density of various food groups per pound showing examples of each food groups was provided to the patient.  - she is advised to maintain close follow up with [name], NP for primary care needs.   I spent  26  minutes in the care of the patient today including review of labs from Thyroid Function, CMP, and other relevant labs ; imaging/biopsy records (current and previous including abstractions  from other facilities); face-to-face time discussing  her lab results and symptoms, medications doses, her options of short and long term treatment based on the latest standards of care / guidelines;   and documenting the encounter.  [employer]  participated in the discussions, expressed understanding, and voiced agreement with the above plans.  All questions were answered to her satisfaction. she is encouraged to contact clinic should she have any questions or concerns prior to her return visit.   Follow up plan: Return in about 6 months (around 06/09/2023) for Fasting Labs  in AM B4 8.   Marquis Lunch, MD [facility] [address] [place], [place] [address] Phone: [phone]  Fax: [phone]     12/09/2022, 5:05 PM  This note was partially dictated with voice  recognition software. Similar sounding words can be transcribed inadequately or may not  be corrected upon review.

## 2022-12-09 NOTE — Progress Notes (Deleted)
Complete physical exam  Patient: Beth Lane   DOB: 1985/09/29   37 y.o. Female  MRN: [id number]  Subjective:    No chief complaint on file.   Beth Lane is a 37 y.o. female who presents today for a complete physical exam. She reports consuming a {diet types:17450} diet. {types:19826} She generally feels {DESC; WELL/FAIRLY WELL/POORLY:18703}. She reports sleeping {DESC; WELL/FAIRLY WELL/POORLY:18703}. She {does/does not:200015} have additional problems to discuss today.    Most recent fall risk assessment:    06/28/2022   11:33 AM  Fall Risk   Falls in the past year? 1  Number falls in past yr: 1  Injury with Fall? 0  Risk for fall due to : History of fall(s)  Follow up Falls evaluation completed     Most recent depression screenings:    06/28/2022   11:33 AM 11/09/2021    2:55 PM  PHQ 2/9 Scores  PHQ - 2 Score 6 6  PHQ- 9 Score 23 19    {VISON DENTAL STD PSA (Optional):27386}  {History (Optional):23778}  Patient Care Team: [name], NP as PCP - General (Nurse Practitioner)   Outpatient Medications Prior to Visit  Medication Sig   Aluminum & Magnesium Hydroxide (MAGNESIUM-ALUMINUM PO) Take by mouth.   blood glucose meter kit and supplies KIT Dispense based on patient and insurance preference. Use up to four times daily as directed. Please include lancets, test strips, control solution.   diclofenac Sodium (VOLTAREN) 1 % GEL Apply 2 Grams topically to affected areas 3 times daily as needed   esomeprazole (NEXIUM) 40 MG capsule TAKE ONE CAPSULE BY MOUTH DAILY AT NOON   fluticasone-salmeterol (ADVAIR DISKUS) 250-50 MCG/ACT AEPB Inhale 1 puff into the lungs 2 (two) times daily. in the morning and at bedtime.   Fremanezumab-vfrm (AJOVY) 225 MG/1.5ML SOSY INJECT 225 MG UNDER THE SKIN EVERY 30 DAYS   Lido-Capsaicin-Men-Methyl Sal (1ST MEDX-PATCH/ LIDOCAINE) 4-0.025-5-20 % PTCH Apply topically as instructed.   lidocaine (LIDODERM) 5 % Place  1 patch onto the skin daily.   lidocaine-prilocaine (EMLA) cream Apply topically 3 (three) times daily as needed. SMARTSIG:2 Gram(s) Topical 3 Times Daily PRN Strength: 2.5-2.5 %   linaCLOtide (LINZESS PO) Take by mouth.   methocarbamol (ROBAXIN) 750 MG tablet Take 1 tablet (750 mg total) by mouth every 6 (six) hours as needed.   Oxycodone HCl 10 MG TABS Take 10 mg by mouth every 6 (six) hours as needed.   PARoxetine (PAXIL) 40 MG tablet TAKE 1 TABLET(40 MG) BY MOUTH DAILY   prazosin (MINIPRESS) 1 MG capsule TAKE 3 CAPSULES(3 MG) BY MOUTH AT BEDTIME   pregabalin (LYRICA) 150 MG capsule Take 1 capsule (150 mg total) by mouth 3 (three) times daily.   Ubrogepant (UBRELVY) 50 MG TABS TAKE 1 TABLET BY MOUTH AS NEEDED FOR MIGRAINE   VYVANSE 40 MG capsule Take 1 capsule (40 mg total) by mouth every morning. NO FURTHER REFILLS UNTIL IN PERSON APPT   No facility-administered medications prior to visit.    ROS        Objective:     There were no vitals taken for this visit. {Vitals History (Optional):23777}  Physical Exam   No results found for any visits on 12/09/22. {Show previous labs (optional):23779}    Assessment & Plan:    Routine Health Maintenance and Physical Exam  Immunization History  Administered Date(s) Administered   Influenza-Unspecified 01/03/2013, 01/27/2018   MMR 08/08/2009   PPD Test 01/05/2013   Pneumococcal Polysaccharide-23  09/19/2010   Td 09/05/2014   Tdap 09/19/2010, 09/27/2011, 01/03/2013    Health Maintenance  Topic Date Due   HIV Screening  Never done   Hepatitis C Screening  Never done   Cervical Cancer Screening (HPV/Pap Cotest)  07/03/2020   INFLUENZA VACCINE  09/16/2022   DTaP/Tdap/Td (5 - Td or Tdap) 09/04/2024   HPV VACCINES  Aged Out   COVID-19 Vaccine  Discontinued    Discussed health benefits of physical activity, and encouraged her to engage in regular exercise appropriate for her age and condition.  Problem List Items Addressed  This Visit   None  No follow-ups on file.     [name], NP

## 2022-12-21 ENCOUNTER — Encounter: Payer: Self-pay | Admitting: Medical-Surgical

## 2022-12-21 ENCOUNTER — Ambulatory Visit (INDEPENDENT_AMBULATORY_CARE_PROVIDER_SITE_OTHER): Payer: MEDICAID | Admitting: Medical-Surgical

## 2022-12-21 VITALS — BP 131/92 | HR 102 | Resp 20 | Ht 65.0 in | Wt 253.1 lb

## 2022-12-21 DIAGNOSIS — K219 Gastro-esophageal reflux disease without esophagitis: Secondary | ICD-10-CM | POA: Diagnosis not present

## 2022-12-21 DIAGNOSIS — R42 Dizziness and giddiness: Secondary | ICD-10-CM

## 2022-12-21 DIAGNOSIS — Z0000 Encounter for general adult medical examination without abnormal findings: Secondary | ICD-10-CM

## 2022-12-21 DIAGNOSIS — F488 Other specified nonpsychotic mental disorders: Secondary | ICD-10-CM

## 2022-12-21 DIAGNOSIS — F411 Generalized anxiety disorder: Secondary | ICD-10-CM

## 2022-12-21 DIAGNOSIS — F410 Panic disorder [episodic paroxysmal anxiety] without agoraphobia: Secondary | ICD-10-CM

## 2022-12-21 DIAGNOSIS — G43709 Chronic migraine without aura, not intractable, without status migrainosus: Secondary | ICD-10-CM

## 2022-12-21 DIAGNOSIS — Z23 Encounter for immunization: Secondary | ICD-10-CM | POA: Diagnosis not present

## 2022-12-21 DIAGNOSIS — M797 Fibromyalgia: Secondary | ICD-10-CM

## 2022-12-21 DIAGNOSIS — R55 Syncope and collapse: Secondary | ICD-10-CM

## 2022-12-21 DIAGNOSIS — G47419 Narcolepsy without cataplexy: Secondary | ICD-10-CM

## 2022-12-21 DIAGNOSIS — E782 Mixed hyperlipidemia: Secondary | ICD-10-CM | POA: Diagnosis not present

## 2022-12-21 DIAGNOSIS — J4540 Moderate persistent asthma, uncomplicated: Secondary | ICD-10-CM

## 2022-12-21 MED ORDER — PRAZOSIN HCL 1 MG PO CAPS: ORAL_CAPSULE | ORAL | 1 refills | Status: DC

## 2022-12-21 MED ORDER — FLUTICASONE-SALMETEROL 250-50 MCG/ACT IN AEPB: 1.0000 | INHALATION_SPRAY | Freq: Two times a day (BID) | RESPIRATORY_TRACT | 1 refills | Status: AC

## 2022-12-21 MED ORDER — PAROXETINE HCL 20 MG PO TABS: 20.0000 mg | ORAL_TABLET | Freq: Every day | ORAL | 1 refills | Status: DC

## 2022-12-21 MED ORDER — 1ST MEDX-PATCH/ LIDOCAINE 4-0.025-5-20 % EX PTCH: MEDICATED_PATCH | CUTANEOUS | 3 refills | Status: AC

## 2022-12-21 MED ORDER — ESOMEPRAZOLE MAGNESIUM 40 MG PO CPDR
DELAYED_RELEASE_CAPSULE | ORAL | 1 refills | Status: DC
Start: 2022-12-21 — End: 2024-01-04

## 2022-12-21 MED ORDER — PREGABALIN 150 MG PO CAPS: 150.0000 mg | ORAL_CAPSULE | Freq: Three times a day (TID) | ORAL | 5 refills | Status: DC

## 2022-12-21 MED ORDER — LIDOCAINE-PRILOCAINE 2.5-2.5 % EX CREA: TOPICAL_CREAM | Freq: Three times a day (TID) | CUTANEOUS | 3 refills | Status: DC | PRN

## 2022-12-21 NOTE — Progress Notes (Unsigned)
        Established patient visit  History, exam, impression, and plan:  Primary female hypogonadism Up-to-date testosterone labs.  Due for his next testosterone injection.  Doing well on his current regimen with stable vitals.  Testosterone cypionate 100 mg IM given x 1 in the office.  Due for next shot in 2 weeks.  Lump of skin of lower extremity, left Noted a lump on the left posterior calf approximately 1 month ago.  Initially, had redness, tenderness, swelling at the area however this has fully resolved.  No longer has any residual symptoms but the lump has remained.  His wife noticed it and urged him to be seen to evaluate it.  History notable for varicose veins, compliant with recommendations for compression socks daily.  No recent injury or trauma to the area.  Unclear etiology.  The lump is approximately 1 cm x 1.5 cm and slightly discolored.  See clinical photo.  Plan to get vascular ultrasound as it does seem to be connected to one of the varicose veins that runs through the area.  Suspect superficial thrombophlebitis initially.  Discussed conservative measures with Tylenol, heat, ice, and compression socks.    Procedures performed this visit: None.  Return in about 2 weeks (around 05/26/2022) for nurse visit for testosterone shot.  __________________________________ Joy L. Jessup, DNP, APRN, FNP-BC Primary Care and Sports Medicine [facility] [place]  

## 2022-12-30 ENCOUNTER — Telehealth: Payer: Medicaid Other | Admitting: Medical-Surgical

## 2023-01-26 ENCOUNTER — Institutional Professional Consult (permissible substitution): Payer: MEDICAID | Admitting: Neurology

## 2023-02-07 ENCOUNTER — Ambulatory Visit: Payer: MEDICAID

## 2023-02-07 ENCOUNTER — Encounter: Payer: Self-pay | Admitting: Medical-Surgical

## 2023-02-07 ENCOUNTER — Ambulatory Visit (INDEPENDENT_AMBULATORY_CARE_PROVIDER_SITE_OTHER): Payer: MEDICAID | Admitting: Medical-Surgical

## 2023-02-07 VITALS — BP 125/86 | HR 88 | Resp 20 | Ht 65.0 in | Wt 251.3 lb

## 2023-02-07 DIAGNOSIS — R062 Wheezing: Secondary | ICD-10-CM

## 2023-02-07 DIAGNOSIS — R051 Acute cough: Secondary | ICD-10-CM | POA: Diagnosis not present

## 2023-02-07 DIAGNOSIS — J4541 Moderate persistent asthma with (acute) exacerbation: Secondary | ICD-10-CM | POA: Diagnosis not present

## 2023-02-07 LAB — POCT RAPID STREP A (OFFICE): Rapid Strep A Screen: NEGATIVE

## 2023-02-07 LAB — POCT INFLUENZA A/B
Influenza A, POC: NEGATIVE
Influenza B, POC: NEGATIVE

## 2023-02-07 LAB — POC COVID19 BINAXNOW: SARS Coronavirus 2 Ag: NEGATIVE

## 2023-02-07 MED ORDER — FULL KIT NEBULIZER SET MISC: 0 refills | Status: DC

## 2023-02-07 MED ORDER — ALBUTEROL SULFATE (2.5 MG/3ML) 0.083% IN NEBU: 2.5000 mg | INHALATION_SOLUTION | Freq: Four times a day (QID) | RESPIRATORY_TRACT | 1 refills | Status: AC | PRN

## 2023-02-07 MED ORDER — PROMETHAZINE-DM 6.25-15 MG/5ML PO SYRP: 5.0000 mL | ORAL_SOLUTION | Freq: Four times a day (QID) | ORAL | 0 refills | Status: DC | PRN

## 2023-02-07 MED ORDER — DOXYCYCLINE HYCLATE 100 MG PO TABS: 100.0000 mg | ORAL_TABLET | Freq: Two times a day (BID) | ORAL | 0 refills | Status: DC

## 2023-02-07 MED ORDER — FULL KIT NEBULIZER SET MISC: 0 refills | Status: AC

## 2023-02-07 MED ORDER — PREDNISONE 50 MG PO TABS: 50.0000 mg | ORAL_TABLET | Freq: Every day | ORAL | 0 refills | Status: DC

## 2023-02-07 NOTE — Progress Notes (Signed)
        Established patient visit  History, exam, impression, and plan:  1. Moderate persistent asthma with exacerbation (Primary) Pleasant 37 year old female presenting today with reports of 2 weeks of upper respiratory symptoms that started out with bodyaches and viral type symptoms.  Unfortunately, her symptoms have progressively worsened and she is now experiencing a cough with significant shortness of breath even at rest.  Lung sounds clear to auscultation, diminished.  Tachypneic with mildly labored respirations at rest.  Afebrile.  And oriented x 3.  Has been using her inhalers as prescribed with minimal benefit.  Albuterol nebulizer treatment given in office today with good response.  Chest x-ray today.  Treating with doxycycline twice daily x 7 days and prednisone 50 mg daily x 5 days.  Sending in albuterol nebulizer solution and a printed prescription for a nebulizer machine given to patient. - promethazine-dextromethorphan (PROMETHAZINE-DM) 6.25-15 MG/5ML syrup; Take 5 mLs by mouth 4 (four) times daily as needed for cough.  Dispense: 118 mL; Refill: 0 - predniSONE (DELTASONE) 50 MG tablet; Take 1 tablet (50 mg total) by mouth daily with breakfast.  Dispense: 5 tablet; Refill: 0 - doxycycline (VIBRA-TABS) 100 MG tablet; Take 1 tablet (100 mg total) by mouth 2 (two) times daily.  Dispense: 14 tablet; Refill: 0 - albuterol (PROVENTIL) (2.5 MG/3ML) 0.083% nebulizer solution; Take 3 mLs (2.5 mg total) by nebulization every 6 (six) hours as needed for wheezing or shortness of breath.  Dispense: 150 mL; Refill: 1 - Respiratory Therapy Supplies (FULL KIT NEBULIZER SET) MISC; Nebulizer machine of patient's choice, please include tubes and hoses as needed.  Use as directed.  Dispense: 1 each; Refill: 0  2. Acute cough POCT flu, strep, and COVID all negative today.  Getting chest x-ray as noted above. - POCT Influenza A/B - POCT rapid strep A - POC COVID-19 - DG Chest 2 View; Future  Procedures  performed this visit: None.  Return if symptoms worsen or fail to improve.  __________________________________ Thayer Ohm, DNP, APRN, FNP-BC Primary Care and Sports Medicine [facility] [place]

## 2023-02-17 ENCOUNTER — Encounter: Payer: Self-pay | Admitting: Medical-Surgical

## 2023-02-22 ENCOUNTER — Institutional Professional Consult (permissible substitution): Payer: MEDICAID | Admitting: Neurology

## 2023-03-07 ENCOUNTER — Telehealth: Payer: Self-pay | Admitting: Neurology

## 2023-03-07 ENCOUNTER — Institutional Professional Consult (permissible substitution): Payer: MEDICAID | Admitting: Neurology

## 2023-03-07 NOTE — Telephone Encounter (Signed)
Canceled and NS x 2 for sleep consult, please review. Please do not reschedule with me at this point. Also NS with me in 5/23 for new pt visit.

## 2023-03-11 ENCOUNTER — Encounter: Payer: Self-pay | Admitting: Medical-Surgical

## 2023-03-14 ENCOUNTER — Inpatient Hospital Stay: Payer: Medicaid Other | Admitting: Medical-Surgical

## 2023-03-14 ENCOUNTER — Encounter (HOSPITAL_COMMUNITY): Payer: Self-pay

## 2023-03-14 ENCOUNTER — Emergency Department (HOSPITAL_COMMUNITY)
Admission: EM | Admit: 2023-03-14 | Discharge: 2023-03-14 | Disposition: A | Discharge status: Home / Self Care | Payer: MEDICAID | Attending: Emergency Medicine | Admitting: Emergency Medicine

## 2023-03-14 ENCOUNTER — Emergency Department (HOSPITAL_COMMUNITY): Payer: MEDICAID

## 2023-03-14 ENCOUNTER — Other Ambulatory Visit: Payer: Self-pay

## 2023-03-14 DIAGNOSIS — N3000 Acute cystitis without hematuria: Secondary | ICD-10-CM | POA: Insufficient documentation

## 2023-03-14 DIAGNOSIS — J45909 Unspecified asthma, uncomplicated: Secondary | ICD-10-CM | POA: Diagnosis not present

## 2023-03-14 DIAGNOSIS — Z7951 Long term (current) use of inhaled steroids: Secondary | ICD-10-CM | POA: Diagnosis not present

## 2023-03-14 DIAGNOSIS — E876 Hypokalemia: Secondary | ICD-10-CM | POA: Insufficient documentation

## 2023-03-14 DIAGNOSIS — R079 Chest pain, unspecified: Secondary | ICD-10-CM | POA: Insufficient documentation

## 2023-03-14 LAB — CBC
HCT: 35.4 % — ABNORMAL LOW (ref 36.0–46.0)
Hemoglobin: 12.7 g/dL (ref 12.0–15.0)
MCH: 32.4 pg (ref 26.0–34.0)
MCHC: 35.9 g/dL (ref 30.0–36.0)
MCV: 90.3 fL (ref 80.0–100.0)
Platelets: 247 10*3/uL (ref 150–400)
RBC: 3.92 MIL/uL (ref 3.87–5.11)
RDW: 12.4 % (ref 11.5–15.5)
WBC: 5.2 10*3/uL (ref 4.0–10.5)
nRBC: 0 % (ref 0.0–0.2)

## 2023-03-14 LAB — URINALYSIS, ROUTINE W REFLEX MICROSCOPIC
Bilirubin Urine: NEGATIVE
Glucose, UA: NEGATIVE mg/dL
Hgb urine dipstick: NEGATIVE
Ketones, ur: NEGATIVE mg/dL
Nitrite: POSITIVE — AB
Protein, ur: NEGATIVE mg/dL
Specific Gravity, Urine: 1.021 (ref 1.005–1.030)
pH: 5 (ref 5.0–8.0)

## 2023-03-14 LAB — COMPREHENSIVE METABOLIC PANEL
ALT: 18 U/L (ref 0–44)
AST: 18 U/L (ref 15–41)
Albumin: 3.7 g/dL (ref 3.5–5.0)
Alkaline Phosphatase: 51 U/L (ref 38–126)
Anion gap: 11 (ref 5–15)
BUN: 13 mg/dL (ref 6–20)
CO2: 22 mmol/L (ref 22–32)
Calcium: 9.2 mg/dL (ref 8.9–10.3)
Chloride: 105 mmol/L (ref 98–111)
Creatinine, Ser: 1.01 mg/dL — ABNORMAL HIGH (ref 0.44–1.00)
GFR, Estimated: >60 mL/min (ref >60)
Glucose, Bld: 91 mg/dL (ref 70–99)
Potassium: 3.4 mmol/L — ABNORMAL LOW (ref 3.5–5.1)
Sodium: 138 mmol/L (ref 135–145)
Total Bilirubin: 0.6 mg/dL (ref 0.0–1.2)
Total Protein: 6.5 g/dL (ref 6.5–8.1)

## 2023-03-14 LAB — TROPONIN I (HIGH SENSITIVITY): Troponin I (High Sensitivity): <2 ng/L (ref <18)

## 2023-03-14 LAB — BRAIN NATRIURETIC PEPTIDE: B Natriuretic Peptide: 6.3 pg/mL (ref 0.0–100.0)

## 2023-03-14 LAB — D-DIMER, QUANTITATIVE: D-Dimer, Quant: 0.33 ug{FEU}/mL (ref 0.00–0.50)

## 2023-03-14 MED ORDER — CEFADROXIL 500 MG PO CAPS: 500.0000 mg | ORAL_CAPSULE | Freq: Two times a day (BID) | ORAL | 0 refills | Status: DC

## 2023-03-14 NOTE — ED Provider Notes (Signed)
[place] EMERGENCY DEPARTMENT AT [facility] Provider Note   CSN: [id number] Arrival date & time: 03/14/23  1216     History  No chief complaint on file.   Beth Lane is a 38 y.o. female with past medical history significant for asthma, PTSD, fibromyalgia, panic disorder, OCD, anxiety, depression, degenerative disc disease, obesity, chronic pain who presents with concern for chest pain, syncope several days ago.  Patient was seen evaluated at [facility], left after 4 hours, patient reports that an EKG showed septal infarct and PCP told patient they need to be evaluated in the ED after having some sharp pain in the chest today.  Endorse some intermittent nausea without vomiting.  Patient reports that she currently vapes.  She endorses some possible face and hand swelling.  No history of heart failure, she takes oxycodone chronically for chronic pain.  She also endorses some GERD, abdominal pain worse after eating.  Denies the chest pain being worse after eating.  HPI     Home Medications Prior to Admission medications   Medication Sig Start Date End Date Taking? Authorizing Provider  cefadroxil (DURICEF) 500 MG capsule Take 1 capsule (500 mg total) by mouth 2 (two) times daily. 03/14/23  Yes Kodi Steil H, PA-C  albuterol (PROVENTIL) (2.5 MG/3ML) 0.083% nebulizer solution Take 3 mLs (2.5 mg total) by nebulization every 6 (six) hours as needed for wheezing or shortness of breath. 02/07/23   [name], NP  Aluminum & Magnesium Hydroxide (MAGNESIUM-ALUMINUM PO) Take by mouth.    [provider]  blood glucose meter kit and supplies KIT Dispense based on patient and insurance preference. Use up to four times daily as directed. Please include lancets, test strips, control solution. 11/10/21   [name], NP  diclofenac Sodium (VOLTAREN) 1 % GEL Apply 2 Grams topically to affected areas 3 times daily as needed 06/28/22   [name], NP  doxycycline  (VIBRA-TABS) 100 MG tablet Take 1 tablet (100 mg total) by mouth 2 (two) times daily. 02/07/23   [name], NP  esomeprazole (NEXIUM) 40 MG capsule TAKE ONE CAPSULE BY MOUTH DAILY AT NOON 12/21/22   [name], NP  fluticasone-salmeterol (ADVAIR DISKUS) 250-50 MCG/ACT AEPB Inhale 1 puff into the lungs 2 (two) times daily. in the morning and at bedtime. 12/21/22   [name], NP  Lido-Capsaicin-Men-Methyl Sal (1ST MEDX-PATCH/ LIDOCAINE) 4-0.025-5-20 % PTCH Apply topically as instructed. 12/21/22   [name], NP  lidocaine (LIDODERM) 5 % Place 1 patch onto the skin daily. 01/17/22   [provider]  lidocaine-prilocaine (EMLA) cream Apply topically 3 (three) times daily as needed. SMARTSIG:2 Gram(s) Topical 3 Times Daily PRN Strength: 2.5-2.5 % 12/21/22   [name], NP  linaCLOtide (LINZESS PO) Take by mouth.    [provider]  methocarbamol (ROBAXIN) 750 MG tablet Take 1 tablet (750 mg total) by mouth every 6 (six) hours as needed. 08/16/22   [name], NP  Oxycodone HCl 10 MG TABS Take 10 mg by mouth every 6 (six) hours as needed. 01/16/21   [provider]  PARoxetine (PAXIL) 20 MG tablet Take 1 tablet (20 mg total) by mouth daily. 12/21/22   [name], NP  prazosin (MINIPRESS) 1 MG capsule TAKE 3 CAPSULES(3 MG) BY MOUTH AT BEDTIME 12/21/22   [name], NP  predniSONE (DELTASONE) 50 MG tablet Take 1 tablet (50 mg total) by mouth daily with breakfast. 02/07/23   [name], NP  pregabalin (LYRICA) 150 MG capsule Take  1 capsule (150 mg total) by mouth 3 (three) times daily. 12/21/22   [name], NP  promethazine-dextromethorphan (PROMETHAZINE-DM) 6.25-15 MG/5ML syrup Take 5 mLs by mouth 4 (four) times daily as needed for cough. 02/07/23   [name], NP  Respiratory Therapy Supplies (FULL KIT NEBULIZER SET) MISC Nebulizer machine of patient's choice, please include tubes and hoses as needed.  Use as directed. 02/07/23   [name], NP  rosuvastatin (CRESTOR) 5 MG  tablet Take 1 tablet (5 mg total) by mouth daily. 12/09/22   Roma Kayser, MD  VYVANSE 40 MG capsule Take 1 capsule (40 mg total) by mouth every morning. NO FURTHER REFILLS UNTIL IN PERSON APPT 06/28/22   [name], NP      Allergies    Sumatriptan, Barium sulfate, and Tape    Review of Systems   Review of Systems  All other systems reviewed and are negative.   Physical Exam Updated Vital Signs BP (!) 138/90 (BP Location: Left Arm)   Pulse 80   Temp 98.5 F (36.9 C) (Oral)   Resp 18   Wt 114.3 kg   SpO2 99%   BMI 41.93 kg/m  Physical Exam Vitals and nursing note reviewed.  Constitutional:      General: She is not in acute distress.    Appearance: Normal appearance.  HENT:     Head: Normocephalic and atraumatic.  Eyes:     General:        Right eye: No discharge.        Left eye: No discharge.  Cardiovascular:     Rate and Rhythm: Normal rate and regular rhythm.     Heart sounds: No murmur heard.    No friction rub. No gallop.  Pulmonary:     Effort: Pulmonary effort is normal.     Breath sounds: Normal breath sounds.  Abdominal:     General: Bowel sounds are normal.     Palpations: Abdomen is soft.  Musculoskeletal:     Comments: Some tenderness to palpation of central chest which is reproducible, no rebound, rigidity, guarding.  Skin:    General: Skin is warm and dry.     Capillary Refill: Capillary refill takes less than 2 seconds.  Neurological:     Mental Status: She is alert and oriented to person, place, and time.  Psychiatric:        Mood and Affect: Mood normal.        Behavior: Behavior normal.     ED Results / Procedures / Treatments   Labs (all labs ordered are listed, but only abnormal results are displayed) Labs Reviewed  CBC - Abnormal; Notable for the following components:      Result Value   HCT 35.4 (*)    All other components within normal limits  COMPREHENSIVE METABOLIC PANEL - Abnormal; Notable for the following  components:   Potassium 3.4 (*)    Creatinine, Ser 1.01 (*)    All other components within normal limits  URINALYSIS, ROUTINE W REFLEX MICROSCOPIC - Abnormal; Notable for the following components:   APPearance HAZY (*)    Nitrite POSITIVE (*)    Leukocytes,Ua SMALL (*)    Bacteria, UA MANY (*)    All other components within normal limits  D-DIMER, QUANTITATIVE  BRAIN NATRIURETIC PEPTIDE  TROPONIN I (HIGH SENSITIVITY)    EKG None  Radiology DG Chest 2 View Result Date: 03/14/2023 CLINICAL DATA:  Chest pain. EXAM: CHEST - 2 VIEW COMPARISON:  None Available.  FINDINGS: The heart size and mediastinal contours are within normal limits. Both lungs are clear. The visualized skeletal structures are unremarkable. Negative for a pneumothorax. IMPRESSION: No active cardiopulmonary disease. Electronically Signed   By: Richarda Overlie M.D.   On: 03/14/2023 13:29    Procedures Procedures    Medications Ordered in ED Medications - No data to display  ED Course/ Medical Decision Making/ A&P Clinical Course as of 03/14/23 1913  Mon Mar 14, 2023  1805 Chest pain off and on since Tuesday. Occasionally stronger, with shortness of breath, endorses central pain. Occasionally more sharp, left sided, with left shoulder pain. No fever, nausea, vomiting, diarrhea. Endorses urinary frequency. Some point tenderness in central chest. Clear breath sounds on exam.  [CP]    Clinical Course User Index [CP] Olene Floss, PA-C                                 Medical Decision Making Amount and/or Complexity of Data Reviewed Labs: ordered.   This patient is a 38 y.o. female  who presents to the ED for concern of chest pain, concern for atypical EKG.   Differential diagnoses prior to evaluation: The emergent differential diagnosis includes, but is not limited to,  ACS, AAS, PE, Mallory-Weiss, Boerhaave's, Pneumonia, acute bronchitis, asthma or COPD exacerbation, anxiety, MSK pain or traumatic injury to  the chest, acid reflux versus other . This is not an exhaustive differential.   Past Medical History / Co-morbidities / Social History: asthma, PTSD, fibromyalgia, panic disorder, OCD, anxiety, depression, degenerative disc disease, obesity, chronic pain  Additional history: Chart reviewed. Pertinent results include: Reviewed lab work, imaging performed at outside facility just a few days ago for similar symptoms  Physical Exam: Physical exam performed. The pertinent findings include: Vital signs are overall stable in the emergency department, mild diastolic hypertension on arrival, blood pressure 125/90, 138/90 on recheck.  No tachycardia, no fever, normal respiratory rate and stable oxygen saturation on room air.  No accessory breath sounds.  Chest pain is reproducible on exam.  Patient is otherwise well-appearing  Lab Tests/Imaging studies: I personally interpreted labs/imaging and the pertinent results include: CBC unremarkable, CMP notable for very mild hypokalemia, testing 3.4.  Troponin is undetectable x 1 in context of atypical chest pain ongoing for several days.  Do not think delta troponin is necessary at this time.  D-dimer is negative, normal BNP.  UA is suspicious for urinary tract infection, nitrate positive, small leukocytes, 21-50 white blood cells and many bacteria.  We will treat as urinary tract infection.  I independently interpreted plain film chest x-ray which shows no evidence of acute intrathoracic abnormality.. I agree with the radiologist interpretation.  Cardiac monitoring: EKG obtained and interpreted by myself and attending physician which shows: Normal sinus rhythm, no evidence of septal infarct, acute ST-T changes, or other abnormality   Medications: We will treat urinary tract infection with cefadroxil, encouraged cardiology follow-up for possible POTS evaluation as patient already planned, no need for additional workup in the emergency department today    Disposition: After consideration of the diagnostic results and the patients response to treatment, I feel that patient with unclear etiology for chest pain, suspect GERD, versus anxiety, versus related to chronic pain, no clinical evidence for ACS, PE, low clinical suspicion for dissection, pulses are equal bilaterally, vital signs stable, pain not consistent with dissection given intermittent nature, reproducibility  emergency department workup does  not suggest an emergent condition requiring admission or immediate intervention beyond what has been performed at this time. The plan is: Patient will follow-up with cardiologist as planned, return precautions given.. The patient is safe for discharge and has been instructed to return immediately for worsening symptoms, change in symptoms or any other concerns.  Final Clinical Impression(s) / ED Diagnoses Final diagnoses:  Acute cystitis without hematuria  Chest pain, unspecified type    Rx / DC Orders ED Discharge Orders          Ordered    cefadroxil (DURICEF) 500 MG capsule  2 times daily        03/14/23 1908              [place] 03/14/23 1913    Glyn Ade, MD 03/14/23 2255

## 2023-03-14 NOTE — ED Triage Notes (Signed)
Pt states Tuesday was traveling from [facility], began having cp and had a syncopal episode; evaluated at [facility]; pt states they left after 4 hours;  PCP called pt and said EKG showed septal infarct and pt need to be evaluated in ed; having sharp cp today; hx POTS; endorses nausea, no vomiting; currently vapes; states hands and face have been swollen since Wednesday

## 2023-03-14 NOTE — Discharge Instructions (Signed)
Please take the entire course of antibiotics that I prescribed, and follow-up with cardiologist as you have planned, I did not see any evidence of any acute cardiac syndrome to explain your chest pain today.  Please return if you have severe worsening chest pain, shortness of breath.  I agree with the plan to follow-up for diagnosis and workup related to probable POTS.

## 2023-03-14 NOTE — ED Provider Triage Note (Addendum)
Emergency Medicine Provider Triage Evaluation Note  Lucille Kattaleya Alia , a 38 y.o. female  was evaluated in triage.  Pt complains of CP/dizziness x 1 wk, Hx POTS  Review of Systems  Positive: Syncope, CP, N, SHOB, extremitiy swelling Negative: Fever, chills, V/D, abd pain  Physical Exam  BP (!) 125/90   Pulse 90   Temp 98.8 F (37.1 C)   Resp 18   Wt 114.3 kg   SpO2 98%   BMI 41.93 kg/m  Gen:   Awake, no distress   Resp:  Normal effort  MSK:   Moves extremities without difficulty  Other:  No calf tenderness  Medical Decision Making  Medically screening exam initiated at 12:53 PM.  Appropriate orders placed.  Chai Kienna Moncada was informed that the remainder of the evaluation will be completed by another provider, this initial triage assessment does not replace that evaluation, and the importance of remaining in the ED until their evaluation is complete.  Labs and imaging ordered   Gretta Began 03/14/23 1255    Dolphus Jenny, PA-C 03/14/23 1304

## 2023-03-14 NOTE — Telephone Encounter (Signed)
I called and left a message for patient to return to the ED.

## 2023-03-16 ENCOUNTER — Other Ambulatory Visit: Payer: Self-pay | Admitting: Medical-Surgical

## 2023-03-16 DIAGNOSIS — R87619 Unspecified abnormal cytological findings in specimens from cervix uteri: Secondary | ICD-10-CM

## 2023-04-06 ENCOUNTER — Other Ambulatory Visit: Payer: Self-pay | Admitting: Medical-Surgical

## 2023-04-06 MED ORDER — LIDOCAINE 5 % EX PTCH: 1.0000 | MEDICATED_PATCH | CUTANEOUS | 5 refills | Status: DC

## 2023-04-22 ENCOUNTER — Encounter: Payer: Self-pay | Admitting: Medical-Surgical

## 2023-04-27 ENCOUNTER — Encounter: Payer: Self-pay | Admitting: Sports Medicine

## 2023-04-27 ENCOUNTER — Ambulatory Visit: Payer: MEDICAID

## 2023-04-27 ENCOUNTER — Ambulatory Visit (INDEPENDENT_AMBULATORY_CARE_PROVIDER_SITE_OTHER): Payer: MEDICAID | Admitting: Sports Medicine

## 2023-04-27 DIAGNOSIS — M25512 Pain in left shoulder: Secondary | ICD-10-CM | POA: Diagnosis not present

## 2023-04-27 DIAGNOSIS — G8929 Other chronic pain: Secondary | ICD-10-CM | POA: Diagnosis not present

## 2023-04-27 NOTE — Progress Notes (Signed)
    Procedures performed today:    None.  Independent interpretation of notes and tests performed by another provider:   None.  Brief History, Exam, Impression, and Recommendations:    Left shoulder pain Pleasant 38 year old female with chronic pain syndrome, having increasing pain left shoulder anterior aspect. Beth Lane does have known cervical disc disease, very mild, there have been multiple cervical epidurals, Lyrica, unfortunately they are no longer effective. Today complaint is more over the deltoid with abduction of the shoulder. Pleasant pigment signs as well as weakness to abduction For insurance purposes Beth Lane has findings concerning for rotator cuff tear including a positive Neer's, Hawkins, empty can signs with significant weakness and a positive drop arm sign. For this reason we will proceed with x-rays, MRI, I would like some formal PT, follow-up will depend on what the MRI shows. If large cuff tear then we will refer for surgery, if more cuff tendinopathy, small tearing, or bursitis we will proceed with injection followed by continued therapy.  I spent 30 minutes of total time managing this patient today, this includes chart review, face to face, and non-face to face time.  ____________________________________________ Ihor Austin. Benjamin Stain, M.D., ABFM., CAQSM., AME. Primary Care and Sports Medicine [facility]  Adjunct Professor of Family Medicine  [employer]  Restaurant manager, fast food

## 2023-04-27 NOTE — Assessment & Plan Note (Signed)
 Pleasant 38 year old female with chronic pain syndrome, having increasing pain left shoulder anterior aspect. Thayer Ohm does have known cervical disc disease, very mild, there have been multiple cervical epidurals, Lyrica, unfortunately they are no longer effective. Today complaint is more over the deltoid with abduction of the shoulder. Pleasant pigment signs as well as weakness to abduction For insurance purposes Thayer Ohm has findings concerning for rotator cuff tear including a positive Neer's, Hawkins, empty can signs with significant weakness and a positive drop arm sign. For this reason we will proceed with x-rays, MRI, I would like some formal PT, follow-up will depend on what the MRI shows. If large cuff tear then we will refer for surgery, if more cuff tendinopathy, small tearing, or bursitis we will proceed with injection followed by continued therapy.

## 2023-05-02 ENCOUNTER — Institutional Professional Consult (permissible substitution): Payer: MEDICAID | Admitting: Neurology

## 2023-05-09 ENCOUNTER — Encounter: Payer: Self-pay | Admitting: Sports Medicine

## 2023-05-09 NOTE — Telephone Encounter (Signed)
 Anairis and Myriam Jacobson can ya'll please look into this MRI, hasn't been scheduled yet, maybe because it hasn't been authed?

## 2023-05-09 NOTE — Telephone Encounter (Signed)

## 2023-05-10 NOTE — Telephone Encounter (Signed)
 Authorization is still pending, insurance has requested notes.They have been faxed.Waiting decision. Thank you.

## 2023-05-11 ENCOUNTER — Encounter: Payer: Self-pay | Admitting: Medical-Surgical

## 2023-05-20 ENCOUNTER — Encounter: Payer: Self-pay | Admitting: Sports Medicine

## 2023-05-23 NOTE — Telephone Encounter (Signed)
 I have already done the orders and referrals, this is no longer in my hands.  We have outsourcing department specifically for this reason.  I am going to forward this.

## 2023-05-25 ENCOUNTER — Encounter: Payer: Self-pay | Admitting: Sports Medicine

## 2023-06-08 ENCOUNTER — Ambulatory Visit: Admitting: Sports Medicine

## 2023-06-10 ENCOUNTER — Telehealth: Payer: Self-pay

## 2023-06-10 NOTE — Telephone Encounter (Signed)
 Per [facility] - patient has an [place] expiring on a double MRI study. New auth required. Thanks in advance.

## 2023-06-10 NOTE — Telephone Encounter (Signed)
 Ok, thank you

## 2023-06-14 ENCOUNTER — Encounter: Admitting: Obstetrics and Gynecology

## 2023-06-20 ENCOUNTER — Ambulatory Visit (INDEPENDENT_AMBULATORY_CARE_PROVIDER_SITE_OTHER): Payer: MEDICAID

## 2023-06-20 ENCOUNTER — Ambulatory Visit: Payer: MEDICAID | Admitting: "Endocrinology

## 2023-06-20 ENCOUNTER — Encounter: Payer: Self-pay | Admitting: Medical-Surgical

## 2023-06-20 ENCOUNTER — Ambulatory Visit: Payer: MEDICAID | Admitting: Medical-Surgical

## 2023-06-20 VITALS — BP 93/66 | HR 89 | Temp 99.1°F | Resp 20 | Ht 65.0 in | Wt 240.1 lb

## 2023-06-20 DIAGNOSIS — R1012 Left upper quadrant pain: Secondary | ICD-10-CM

## 2023-06-20 DIAGNOSIS — R3989 Other symptoms and signs involving the genitourinary system: Secondary | ICD-10-CM

## 2023-06-20 DIAGNOSIS — R197 Diarrhea, unspecified: Secondary | ICD-10-CM | POA: Diagnosis not present

## 2023-06-20 DIAGNOSIS — R112 Nausea with vomiting, unspecified: Secondary | ICD-10-CM | POA: Diagnosis not present

## 2023-06-20 LAB — POCT URINALYSIS DIP (CLINITEK)
Bilirubin, UA: NEGATIVE
Glucose, UA: NEGATIVE mg/dL
Nitrite, UA: NEGATIVE
POC PROTEIN,UA: 100 — AB
Spec Grav, UA: 1.025 (ref 1.010–1.025)
Urobilinogen, UA: 1 U/dL
pH, UA: 6 (ref 5.0–8.0)

## 2023-06-20 MED ORDER — ONDANSETRON 4 MG PO TBDP: 4.0000 mg | ORAL_TABLET | Freq: Three times a day (TID) | ORAL | 0 refills | Status: DC | PRN

## 2023-06-20 NOTE — Progress Notes (Signed)
 a       Established patient visit  History, exam, impression, and plan:  1. LUQ pain (Primary) 2. Nausea vomiting and diarrhea Pleasant 38 year old female accompanied by her wife presenting today with complaints of several days of severe left upper quadrant pain that radiates through to her back.  She has also been experiencing nausea with vomiting, often in violent episodes.  Has been having diarrhea.  Notes that her stools are very loose and look to be oily.  Denies fever although her temperature is 99.1 F in office today.  Having difficulty holding down any food or fluids.  The only thing she has found that she is able to tolerate is Gatorade and small quantities on a frequent basis.  Notes that her urine is very dark and she feels very weak and tired.  On exam, abdomen is soft, nondistended.  Tender to the left upper quadrant under the edge of the ribs and extending down to the upper part of the left lower quadrant.  Positive left CVA.  Denies melena, hematochezia, and hematemesis.  Has been taking Imodium and Dramamine without benefit.  Given her significant pain with palpation of the left upper quadrant and under the edge of her ribs, plan for chest x-ray, stat CT abdomen pelvis, and labs as below.  Adding in Zofran 4 mg every 8 hours as needed for nausea management.  Discontinue Imodium.  Start with a clear liquid diet advancing to full liquid then soft bland foods as tolerated. - CT ABDOMEN PELVIS WO CONTRAST; Future - CBC with Differential/Platelet - CMP14+EGFR - Amylase - Lipase - DG Chest 2 View; Future  Procedures performed this visit: None.  Return if symptoms worsen or fail to improve.  __________________________________ Maryl Snook, DNP, APRN, FNP-BC Primary Care and Sports Medicine [facility] [place] [place]

## 2023-06-21 ENCOUNTER — Encounter: Payer: Self-pay | Admitting: Medical-Surgical

## 2023-06-21 LAB — CBC WITH DIFFERENTIAL/PLATELET
Basophils Absolute: 0 10*3/uL (ref 0.0–0.2)
Basos: 1 %
EOS (ABSOLUTE): 0.1 10*3/uL (ref 0.0–0.4)
Eos: 2 %
Hematocrit: 37.7 % (ref 34.0–46.6)
Hemoglobin: 13.2 g/dL (ref 11.1–15.9)
Immature Grans (Abs): 0 10*3/uL (ref 0.0–0.1)
Immature Granulocytes: 0 %
Lymphocytes Absolute: 2 10*3/uL (ref 0.7–3.1)
Lymphs: 48 %
MCH: 31.8 pg (ref 26.6–33.0)
MCHC: 35 g/dL (ref 31.5–35.7)
MCV: 91 fL (ref 79–97)
Monocytes Absolute: 0.3 10*3/uL (ref 0.1–0.9)
Monocytes: 8 %
Neutrophils Absolute: 1.7 10*3/uL (ref 1.4–7.0)
Neutrophils: 41 %
Platelets: 306 10*3/uL (ref 150–450)
RBC: 4.15 x10E6/uL (ref 3.77–5.28)
RDW: 12.2 % (ref 11.7–15.4)
WBC: 4.2 10*3/uL (ref 3.4–10.8)

## 2023-06-21 LAB — CMP14+EGFR
ALT: 19 IU/L (ref 0–32)
AST: 23 IU/L (ref 0–40)
Albumin: 4.2 g/dL (ref 3.9–4.9)
Alkaline Phosphatase: 79 IU/L (ref 44–121)
BUN/Creatinine Ratio: 16 (ref 9–23)
BUN: 11 mg/dL (ref 6–20)
Bilirubin Total: 0.4 mg/dL (ref 0.0–1.2)
CO2: 22 mmol/L (ref 20–29)
Calcium: 9 mg/dL (ref 8.7–10.2)
Chloride: 103 mmol/L (ref 96–106)
Creatinine, Ser: 0.68 mg/dL (ref 0.57–1.00)
Globulin, Total: 2.6 g/dL (ref 1.5–4.5)
Glucose: 76 mg/dL (ref 70–99)
Potassium: 3.9 mmol/L (ref 3.5–5.2)
Sodium: 140 mmol/L (ref 134–144)
Total Protein: 6.8 g/dL (ref 6.0–8.5)
eGFR: 116 mL/min/{1.73_m2} (ref >59)

## 2023-06-21 LAB — LIPASE: Lipase: 15 U/L (ref 14–72)

## 2023-06-21 LAB — AMYLASE: Amylase: 43 U/L (ref 31–110)

## 2023-06-21 NOTE — Telephone Encounter (Signed)
 Printed and placed in your Forms folder

## 2023-06-23 ENCOUNTER — Other Ambulatory Visit: Payer: Self-pay | Admitting: "Endocrinology

## 2023-06-23 ENCOUNTER — Other Ambulatory Visit: Payer: Self-pay

## 2023-06-23 MED ORDER — PROCHLORPERAZINE MALEATE 10 MG PO TABS: 10.0000 mg | ORAL_TABLET | Freq: Four times a day (QID) | ORAL | 0 refills | Status: DC | PRN

## 2023-06-23 NOTE — Telephone Encounter (Signed)
 Spoke with the patient and confirmed what pharmacy she would like us  to send it to. Pt would like all medications sent to [facility]

## 2023-06-23 NOTE — Addendum Note (Signed)
 Addended byDenece Finger on: 06/23/2023 01:10 PM   Modules accepted: Orders

## 2023-06-24 ENCOUNTER — Other Ambulatory Visit: Payer: Self-pay | Admitting: Medical-Surgical

## 2023-06-25 LAB — URINE CULTURE

## 2023-06-27 ENCOUNTER — Encounter: Payer: Self-pay | Admitting: Medical-Surgical

## 2023-06-27 MED ORDER — AMOXICILLIN-POT CLAVULANATE 875-125 MG PO TABS: 1.0000 | ORAL_TABLET | Freq: Two times a day (BID) | ORAL | 0 refills | Status: DC

## 2023-06-27 NOTE — Addendum Note (Signed)
 Addended byCherre Cornish on: 06/27/2023 07:04 AM   Modules accepted: Orders

## 2023-07-01 ENCOUNTER — Encounter: Payer: Self-pay | Admitting: Medical-Surgical

## 2023-07-13 ENCOUNTER — Ambulatory Visit: Payer: MEDICAID | Admitting: "Endocrinology

## 2023-07-18 ENCOUNTER — Encounter: Payer: Self-pay | Admitting: Medical-Surgical

## 2023-07-26 ENCOUNTER — Telehealth: Payer: Self-pay | Admitting: Neurology

## 2023-07-26 NOTE — Telephone Encounter (Signed)
 Patient has been referred back to us  for headaches/syncope by Cherre Cornish and has seen Dr. Gracie Lav previously (last time was in 2023). After looking at our list of providers, she'd prefer to see Dr. Salli Crawley if possible. Would you both be ok with this?

## 2023-07-28 ENCOUNTER — Other Ambulatory Visit: Payer: Self-pay | Admitting: "Endocrinology

## 2023-07-28 ENCOUNTER — Other Ambulatory Visit: Payer: Self-pay | Admitting: Medical-Surgical

## 2023-07-28 DIAGNOSIS — F410 Panic disorder [episodic paroxysmal anxiety] without agoraphobia: Secondary | ICD-10-CM

## 2023-08-03 ENCOUNTER — Telehealth: Payer: MEDICAID | Admitting: Physician Assistant

## 2023-08-03 ENCOUNTER — Encounter: Payer: Self-pay | Admitting: Medical-Surgical

## 2023-08-03 DIAGNOSIS — H6693 Otitis media, unspecified, bilateral: Secondary | ICD-10-CM | POA: Diagnosis not present

## 2023-08-03 MED ORDER — AMOXICILLIN-POT CLAVULANATE 875-125 MG PO TABS: 1.0000 | ORAL_TABLET | Freq: Two times a day (BID) | ORAL | 0 refills | Status: DC

## 2023-08-03 MED ORDER — PREDNISONE 20 MG PO TABS: 40.0000 mg | ORAL_TABLET | Freq: Every day | ORAL | 0 refills | Status: DC

## 2023-08-03 NOTE — Progress Notes (Signed)
 Virtual Visit Consent   [name], you are scheduled for a virtual visit with a [facility] provider today. Just as with appointments in the office, your consent must be obtained to participate. Your consent will be active for this visit and any virtual visit you may have with one of our providers in the next 365 days. If you have a MyChart account, a copy of this consent can be sent to you electronically.  As this is a virtual visit, video technology does not allow for your provider to perform a traditional examination. This may limit your provider's ability to fully assess your condition. If your provider identifies any concerns that need to be evaluated in person or the need to arrange testing (such as labs, EKG, etc.), we will make arrangements to do so. Although advances in technology are sophisticated, we cannot ensure that it will always work on either your end or our end. If the connection with a video visit is poor, the visit may have to be switched to a telephone visit. With either a video or telephone visit, we are not always able to ensure that we have a secure connection.  By engaging in this virtual visit, you consent to the provision of healthcare and authorize for your insurance to be billed (if applicable) for the services provided during this visit. Depending on your insurance coverage, you may receive a charge related to this service.  I need to obtain your verbal consent now. Are you willing to proceed with your visit today? Beth Lane has provided verbal consent on 08/03/2023 for a virtual visit (video or telephone). Angelia Kelp, PA-C  Date: 08/03/2023 7:28 PM   Virtual Visit via Video Note   I, Angelia Kelp, connected with  Beth Lane  ([id number], 1985/06/03) on 08/03/23 at  7:00 PM EDT by a video-enabled telemedicine application and verified that I am speaking with the correct person using two  identifiers.  Location: Patient: Virtual Visit Location Patient: Home Provider: Virtual Visit Location Provider: Home Office   I discussed the limitations of evaluation and management by telemedicine and the availability of in person appointments. The patient expressed understanding and agreed to proceed.    History of Present Illness: Beth Lane is a 38 y.o. who identifies as a female who was assigned female at birth, and is being seen today for bilateral ear pain.  HPI: Sinusitis This is a new problem. The current episode started 1 to 4 weeks ago (worsened today with ear pain). The problem has been gradually worsening since onset. There has been no fever. The pain is moderate. Associated symptoms include congestion, diaphoresis, ear pain (muffled pain), headaches (off and on), sinus pressure and a sore throat (a few days ago, improved). Pertinent negatives include no chills, coughing, hoarse voice or shortness of breath. (General malaise) Past treatments include acetaminophen (motrin, flonase, loratadine). The treatment provided no relief.     Problems:  Patient Active Problem List   Diagnosis Date Noted   Left shoulder pain 04/27/2023   Mixed hyperlipidemia 12/09/2022   Empty sella syndrome (HCC) 02/16/2022   Abnormal laboratory test 07/20/2021   Weakness 07/14/2021   Gait abnormality 07/14/2021   Chronic migraine w/o aura w/o status migrainosus, not intractable 07/14/2021   Morbid obesity (HCC) 07/14/2021   Sinus tachycardia 07/09/2021   Hospital discharge follow-up 07/09/2021   Chronic left hip pain 06/17/2021   DDD (degenerative disc disease), cervical 04/21/2021   Migraine syndrome 03/16/2021  Asthma 03/08/2021   Chronic migraine without aura without status migrainosus, not intractable 05/26/2017   Major depressive disorder 08/06/2016   Panic disorder 03/13/2015   Narcolepsy 03/13/2015   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   Chronic post-traumatic stress  disorder (PTSD) 12/16/2009   Fibromyalgia affecting multiple sites 10/31/2006   OCD (obsessive compulsive disorder) 04/10/2006   Generalized anxiety disorder with panic attacks 04/10/2006   Cardiac arrhythmia 03/17/2006    Allergies:  Allergies  Allergen Reactions   Sumatriptan Anaphylaxis   Barium Sulfate Nausea And Vomiting   Tape Rash   Medications:  Current Outpatient Medications:    amoxicillin -clavulanate (AUGMENTIN ) 875-125 MG tablet, Take 1 tablet by mouth 2 (two) times daily., Disp: 20 tablet, Rfl: 0   predniSONE  (DELTASONE ) 20 MG tablet, Take 2 tablets (40 mg total) by mouth daily with breakfast., Disp: 10 tablet, Rfl: 0   albuterol  (PROVENTIL ) (2.5 MG/3ML) 0.083% nebulizer solution, Take 3 mLs (2.5 mg total) by nebulization every 6 (six) hours as needed for wheezing or shortness of breath., Disp: 150 mL, Rfl: 1   Aluminum & Magnesium  Hydroxide (MAGNESIUM -ALUMINUM PO), Take by mouth., Disp: , Rfl:    blood glucose meter kit and supplies KIT, Dispense based on patient and insurance preference. Use up to four times daily as directed. Please include lancets, test strips, control solution., Disp: 1 each, Rfl: 0   diclofenac  Sodium (VOLTAREN ) 1 % GEL, Apply 2 Grams topically to affected areas 3 times daily as needed, Disp: 50 g, Rfl: 3   esomeprazole  (NEXIUM ) 40 MG capsule, TAKE ONE CAPSULE BY MOUTH DAILY AT NOON, Disp: 90 capsule, Rfl: 1   fluticasone -salmeterol (ADVAIR DISKUS) 250-50 MCG/ACT AEPB, Inhale 1 puff into the lungs 2 (two) times daily. in the morning and at bedtime., Disp: 180 each, Rfl: 1   Lido-Capsaicin -Men-Methyl Sal (1ST MEDX-PATCH/ LIDOCAINE ) 4-0.025-5-20 % PTCH, Apply topically as instructed., Disp: 10 patch, Rfl: 3   lidocaine  (LIDODERM ) 5 %, Place 1 patch onto the skin daily., Disp: 30 patch, Rfl: 5   lidocaine -prilocaine  (EMLA ) cream, Apply topically 3 (three) times daily as needed. SMARTSIG:2 Gram(s) Topical 3 Times Daily PRN Strength: 2.5-2.5 %, Disp: 30 g, Rfl:  3   methocarbamol  (ROBAXIN ) 750 MG tablet, Take 1 tablet (750 mg total) by mouth every 6 (six) hours as needed., Disp: 360 tablet, Rfl: 3   ondansetron  (ZOFRAN -ODT) 4 MG disintegrating tablet, Take 1 tablet (4 mg total) by mouth every 8 (eight) hours as needed for nausea or vomiting., Disp: 20 tablet, Rfl: 0   Oxycodone HCl 10 MG TABS, Take 10 mg by mouth every 6 (six) hours as needed., Disp: , Rfl:    PARoxetine  (PAXIL ) 20 MG tablet, TAKE 1 TABLET BY MOUTH EVERY DAY, Disp: 90 tablet, Rfl: 1   prazosin  (MINIPRESS ) 1 MG capsule, TAKE 3 CAPSULES(3 MG) BY MOUTH AT BEDTIME, Disp: 270 capsule, Rfl: 1   pregabalin  (LYRICA ) 150 MG capsule, Take 1 capsule (150 mg total) by mouth 3 (three) times daily., Disp: 90 capsule, Rfl: 5   prochlorperazine  (COMPAZINE ) 10 MG tablet, Take 1 tablet (10 mg total) by mouth every 6 (six) hours as needed for nausea or vomiting., Disp: 30 tablet, Rfl: 0   Respiratory Therapy Supplies (FULL KIT NEBULIZER SET) MISC, Nebulizer machine of patient's choice, please include tubes and hoses as needed.  Use as directed., Disp: 1 each, Rfl: 0   rosuvastatin  (CRESTOR ) 5 MG tablet, TAKE 1 TABLET BY MOUTH EVERY DAY AT BEDTIME FOR CHOLESTEROL, Disp: 90 tablet, Rfl: 0  VYVANSE  40 MG capsule, Take 1 capsule (40 mg total) by mouth every morning. NO FURTHER REFILLS UNTIL IN PERSON APPT, Disp: 30 capsule, Rfl: 0  Observations/Objective: Patient is well-developed, well-nourished in no acute distress.  Resting comfortably at home.  Head is normocephalic, atraumatic.  No labored breathing.  Speech is clear and coherent with logical content.  Patient is alert and oriented at baseline.    Assessment and Plan: 1. Bilateral acute otitis media (Primary) - predniSONE  (DELTASONE ) 20 MG tablet; Take 2 tablets (40 mg total) by mouth daily with breakfast.  Dispense: 10 tablet; Refill: 0 - amoxicillin -clavulanate (AUGMENTIN ) 875-125 MG tablet; Take 1 tablet by mouth 2 (two) times daily.  Dispense: 20  tablet; Refill: 0  - Worsening symptoms that have not responded to OTC medications.  - Will give Augmentin  and Prednisone  - Continue allergy medications.  - Steam and humidifier can help - Stay well hydrated and get plenty of rest.  - Seek in person evaluation if no symptom improvement or if symptoms worsen   Follow Up Instructions: I discussed the assessment and treatment plan with the patient. The patient was provided an opportunity to ask questions and all were answered. The patient agreed with the plan and demonstrated an understanding of the instructions.  A copy of instructions were sent to the patient via MyChart unless otherwise noted below.    The patient was advised to call back or seek an in-person evaluation if the symptoms worsen or if the condition fails to improve as anticipated.    Angelia Kelp, PA-C

## 2023-08-03 NOTE — Patient Instructions (Signed)
 Beth Lane, thank you for joining Angelia Kelp, PA-C for today's virtual visit.  While this provider is not your primary care provider (PCP), if your PCP is located in our provider database this encounter information will be shared with them immediately following your visit.   A [facility] MyChart account gives you access to today's visit and all your visits, tests, and labs performed at [facility]  click here if you don't have a [facility] MyChart account or go to [email or web address]  Consent: (Patient) Beth Lane provided verbal consent for this virtual visit at the beginning of the encounter.  Current Medications:  Current Outpatient Medications:    amoxicillin -clavulanate (AUGMENTIN ) 875-125 MG tablet, Take 1 tablet by mouth 2 (two) times daily., Disp: 20 tablet, Rfl: 0   predniSONE  (DELTASONE ) 20 MG tablet, Take 2 tablets (40 mg total) by mouth daily with breakfast., Disp: 10 tablet, Rfl: 0   albuterol  (PROVENTIL ) (2.5 MG/3ML) 0.083% nebulizer solution, Take 3 mLs (2.5 mg total) by nebulization every 6 (six) hours as needed for wheezing or shortness of breath., Disp: 150 mL, Rfl: 1   Aluminum & Magnesium  Hydroxide (MAGNESIUM -ALUMINUM PO), Take by mouth., Disp: , Rfl:    blood glucose meter kit and supplies KIT, Dispense based on patient and insurance preference. Use up to four times daily as directed. Please include lancets, test strips, control solution., Disp: 1 each, Rfl: 0   diclofenac  Sodium (VOLTAREN ) 1 % GEL, Apply 2 Grams topically to affected areas 3 times daily as needed, Disp: 50 g, Rfl: 3   esomeprazole  (NEXIUM ) 40 MG capsule, TAKE ONE CAPSULE BY MOUTH DAILY AT NOON, Disp: 90 capsule, Rfl: 1   fluticasone -salmeterol (ADVAIR DISKUS) 250-50 MCG/ACT AEPB, Inhale 1 puff into the lungs 2 (two) times daily. in the morning and at bedtime., Disp: 180 each, Rfl: 1   Lido-Capsaicin -Men-Methyl Sal (1ST MEDX-PATCH/ LIDOCAINE )  4-0.025-5-20 % PTCH, Apply topically as instructed., Disp: 10 patch, Rfl: 3   lidocaine  (LIDODERM ) 5 %, Place 1 patch onto the skin daily., Disp: 30 patch, Rfl: 5   lidocaine -prilocaine  (EMLA ) cream, Apply topically 3 (three) times daily as needed. SMARTSIG:2 Gram(s) Topical 3 Times Daily PRN Strength: 2.5-2.5 %, Disp: 30 g, Rfl: 3   methocarbamol  (ROBAXIN ) 750 MG tablet, Take 1 tablet (750 mg total) by mouth every 6 (six) hours as needed., Disp: 360 tablet, Rfl: 3   ondansetron  (ZOFRAN -ODT) 4 MG disintegrating tablet, Take 1 tablet (4 mg total) by mouth every 8 (eight) hours as needed for nausea or vomiting., Disp: 20 tablet, Rfl: 0   Oxycodone HCl 10 MG TABS, Take 10 mg by mouth every 6 (six) hours as needed., Disp: , Rfl:    PARoxetine  (PAXIL ) 20 MG tablet, TAKE 1 TABLET BY MOUTH EVERY DAY, Disp: 90 tablet, Rfl: 1   prazosin  (MINIPRESS ) 1 MG capsule, TAKE 3 CAPSULES(3 MG) BY MOUTH AT BEDTIME, Disp: 270 capsule, Rfl: 1   pregabalin  (LYRICA ) 150 MG capsule, Take 1 capsule (150 mg total) by mouth 3 (three) times daily., Disp: 90 capsule, Rfl: 5   prochlorperazine  (COMPAZINE ) 10 MG tablet, Take 1 tablet (10 mg total) by mouth every 6 (six) hours as needed for nausea or vomiting., Disp: 30 tablet, Rfl: 0   Respiratory Therapy Supplies (FULL KIT NEBULIZER SET) MISC, Nebulizer machine of patient's choice, please include tubes and hoses as needed.  Use as directed., Disp: 1 each, Rfl: 0   rosuvastatin  (CRESTOR ) 5 MG tablet, TAKE 1 TABLET BY MOUTH EVERY DAY  AT BEDTIME FOR CHOLESTEROL, Disp: 90 tablet, Rfl: 0   VYVANSE  40 MG capsule, Take 1 capsule (40 mg total) by mouth every morning. NO FURTHER REFILLS UNTIL IN PERSON APPT, Disp: 30 capsule, Rfl: 0   Medications ordered in this encounter:  Meds ordered this encounter  Medications   predniSONE  (DELTASONE ) 20 MG tablet    Sig: Take 2 tablets (40 mg total) by mouth daily with breakfast.    Dispense:  10 tablet    Refill:  0    Supervising Provider:    LAMPTEY, PHILIP O [[id number]]   amoxicillin -clavulanate (AUGMENTIN ) 875-125 MG tablet    Sig: Take 1 tablet by mouth 2 (two) times daily.    Dispense:  20 tablet    Refill:  0    Supervising Provider:   LAMPTEY, PHILIP O [[id number]]     *If you need refills on other medications prior to your next appointment, please contact your pharmacy*  Follow-Up: Call back or seek an in-person evaluation if the symptoms worsen or if the condition fails to improve as anticipated.  [facility] [phone]  Other Instructions Otitis Media, Adult  Otitis media occurs when there is inflammation and fluid in the middle ear with signs and symptoms of an acute infection. The middle ear is a part of the ear that contains bones for hearing as well as air that helps send sounds to the brain. When infected fluid builds up in this space, it causes pressure and can lead to an ear infection. The eustachian tube connects the middle ear to the back of the nose (nasopharynx) and normally allows air into the middle ear. If the eustachian tube becomes blocked, fluid can build up and become infected. What are the causes? This condition is caused by a blockage in the eustachian tube. This can be caused by mucus or by swelling of the tube. Problems that can cause a blockage include: A cold or other upper respiratory infection. Allergies. An irritant, such as tobacco smoke. Enlarged adenoids. The adenoids are areas of soft tissue located high in the back of the throat, behind the nose and the roof of the mouth. They are part of the body's defense system (immune system). A mass in the nasopharynx. Damage to the ear caused by pressure changes (barotrauma). What increases the risk? You are more likely to develop this condition if you: Smoke or are exposed to tobacco smoke. Have an opening in the roof of your mouth (cleft palate). Have gastroesophageal reflux. Have an immune system disorder. What are the signs  or symptoms? Symptoms of this condition include: Ear pain. Fever. Decreased hearing. Tiredness (lethargy). Fluid leaking from the ear, if the eardrum is ruptured or has burst. Ringing in the ear. How is this diagnosed?  This condition is diagnosed with a physical exam. During the exam, your health care provider will use an instrument called an otoscope to look in your ear and check for redness, swelling, and fluid. He or she will also ask about your symptoms. Your health care provider may also order tests, such as: A pneumatic otoscopy. This is a test to check the movement of the eardrum. It is done by squeezing a small amount of air into the ear. A tympanogram. This is a test that shows how well the eardrum moves in response to air pressure in the ear canal. It provides a graph for your health care provider to review. How is this treated? This condition can go away on its  own within 3-5 days. But if the condition is caused by a bacterial infection and does not go away on its own, or if it keeps coming back, your health care provider may: Prescribe antibiotic medicine to treat the infection. Prescribe or recommend medicines to control pain. Follow these instructions at home: Take over-the-counter and prescription medicines only as told by your health care provider. If you were prescribed an antibiotic medicine, take it as told by your health care provider. Do not stop taking the antibiotic even if you start to feel better. Keep all follow-up visits. This is important. Contact a health care provider if: You have bleeding from your nose. There is a lump on your neck. You are not feeling better in 5 days. You feel worse instead of better. Get help right away if: You have severe pain that is not controlled with medicine. You have swelling, redness, or pain around your ear. You have stiffness in your neck. A part of your face is not moving (paralyzed). The bone behind your ear (mastoid  bone) is tender when you touch it. You develop a severe headache. Summary Otitis media is redness, soreness, and swelling of the middle ear, usually resulting in pain and decreased hearing. This condition can go away on its own within 3-5 days. If the problem does not go away in 3-5 days, your health care provider may give you medicines to treat the infection. If you were prescribed an antibiotic medicine, take it as told by your health care provider. Follow all instructions that were given to you by your health care provider. This information is not intended to replace advice given to you by your health care provider. Make sure you discuss any questions you have with your health care provider. Document Revised: 05/12/2020 Document Reviewed: 05/12/2020 Elsevier Patient Education  2024 Elsevier Inc.   If you have been instructed to have an in-person evaluation today at a local Urgent Care facility, please use the link below. It will take you to a list of all of our available [facility], including address, phone number and hours of operation. Please do not delay care.  [facility]  If you or a family member do not have a primary care provider, use the link below to schedule a visit and establish care. When you choose a [facility] primary care physician or advanced practice provider, you gain a long-term partner in health. Find a Primary Care Provider  Learn more about [facility]'s in-office and virtual care options:  - Get Care Now

## 2023-08-12 NOTE — Telephone Encounter (Signed)
 Patient informed and will contact GI for scheduling

## 2023-08-12 NOTE — Telephone Encounter (Signed)
 Called and spoke to office they are little behind on referral suggest patients calls and schedule. Will send pt a direct msg.

## 2023-08-23 ENCOUNTER — Institutional Professional Consult (permissible substitution): Payer: MEDICAID | Admitting: Neurology

## 2023-08-24 ENCOUNTER — Encounter: Payer: Self-pay | Admitting: Medical-Surgical

## 2023-08-25 NOTE — Telephone Encounter (Signed)
 Will patient need an appointment to have form completed?

## 2023-08-26 NOTE — Telephone Encounter (Signed)
 Patient schld for 09/08/23 with Beth Lane for physical.

## 2023-08-27 ENCOUNTER — Other Ambulatory Visit: Payer: Self-pay | Admitting: Medical-Surgical

## 2023-08-27 DIAGNOSIS — M797 Fibromyalgia: Secondary | ICD-10-CM

## 2023-09-08 ENCOUNTER — Encounter: Payer: Self-pay | Admitting: Medical-Surgical

## 2023-09-08 ENCOUNTER — Ambulatory Visit (INDEPENDENT_AMBULATORY_CARE_PROVIDER_SITE_OTHER): Payer: MEDICAID | Admitting: Medical-Surgical

## 2023-09-08 VITALS — BP 122/83 | HR 69 | Ht 65.0 in | Wt 246.0 lb

## 2023-09-08 DIAGNOSIS — F324 Major depressive disorder, single episode, in partial remission: Secondary | ICD-10-CM | POA: Diagnosis not present

## 2023-09-08 DIAGNOSIS — F429 Obsessive-compulsive disorder, unspecified: Secondary | ICD-10-CM

## 2023-09-08 DIAGNOSIS — Z111 Encounter for screening for respiratory tuberculosis: Secondary | ICD-10-CM | POA: Diagnosis not present

## 2023-09-08 DIAGNOSIS — R399 Unspecified symptoms and signs involving the genitourinary system: Secondary | ICD-10-CM | POA: Diagnosis not present

## 2023-09-08 DIAGNOSIS — Z0289 Encounter for other administrative examinations: Secondary | ICD-10-CM

## 2023-09-08 DIAGNOSIS — F410 Panic disorder [episodic paroxysmal anxiety] without agoraphobia: Secondary | ICD-10-CM

## 2023-09-08 DIAGNOSIS — G47419 Narcolepsy without cataplexy: Secondary | ICD-10-CM

## 2023-09-08 DIAGNOSIS — R55 Syncope and collapse: Secondary | ICD-10-CM | POA: Insufficient documentation

## 2023-09-08 DIAGNOSIS — F411 Generalized anxiety disorder: Secondary | ICD-10-CM

## 2023-09-08 DIAGNOSIS — G894 Chronic pain syndrome: Secondary | ICD-10-CM | POA: Insufficient documentation

## 2023-09-08 LAB — POCT URINALYSIS DIP (CLINITEK)
Bilirubin, UA: NEGATIVE
Blood, UA: NEGATIVE
Glucose, UA: NEGATIVE mg/dL
Ketones, POC UA: NEGATIVE mg/dL
Leukocytes, UA: NEGATIVE
Nitrite, UA: POSITIVE — AB
POC PROTEIN,UA: NEGATIVE
Spec Grav, UA: 1.025 (ref 1.010–1.025)
Urobilinogen, UA: 0.2 U/dL
pH, UA: 6 (ref 5.0–8.0)

## 2023-09-08 MED ORDER — VYVANSE 40 MG PO CAPS: 40.0000 mg | ORAL_CAPSULE | Freq: Every morning | ORAL | 0 refills | Status: DC

## 2023-09-08 MED ORDER — AMITRIPTYLINE HCL 25 MG PO TABS
25.0000 mg | ORAL_TABLET | Freq: Every day | ORAL | 0 refills | Status: DC
Start: 2023-09-08 — End: 2023-10-05

## 2023-09-08 NOTE — Progress Notes (Signed)
 Established patient visit  Discussed the use of AI scribe software for clinical note transcription with the patient, who gave verbal consent to proceed.  History of Present Illness   Beth Lane is a 38 year old female who presents for completion of a form that is required for a new job.  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 - Experiences episodes with distorted vision and needs to sit to prevent fainting - No episodes of syncope while driving for six months - Learning to recognize early warning signs of syncope  Chronic pain - Chronic pain with significant discomfort since discontinuation of pain medication on July 2nd after discharge from previous pain clinic - Currently taking Lyrica  150 mg three times daily and Robaxin , but pain is not fully controlled - Actively attempting to reduce pain medication  Mental health symptoms - Struggling with mental health and seeking referral to a psychiatrist - Feels overwhelmed and has thoughts of leaving everything behind, without a specific plan - Experiencing family stress, particularly related to her son's behavioral issues  Lower urinary tract symptoms - Urinary frequency and urgency - Discolored urine with malodor - Suspects blood in urine - History of recurrent urinary tract infections since childhood - Multiple medications have not resolved symptoms  Dysphagia and throat discomfort - Difficulty swallowing, attributed to possible tonsil swelling - Sensation of throat tightness when swallowing - No visible erythema of the throat  Disordered eating and weight concerns - Irregular eating patterns, sometimes only eating once every three days - Stomach pain with eating - Weight stable at 246 pounds - Frustrated with weight and considering options for weight loss      Physical Exam Vitals reviewed.  Constitutional:      General: She is not in acute distress.     Appearance: Normal appearance. She is obese. She is not ill-appearing.  HENT:     Head: Normocephalic and atraumatic.  Cardiovascular:     Rate and Rhythm: Normal rate and regular rhythm.     Pulses: Normal pulses.     Heart sounds: Normal heart sounds. No murmur heard.    No friction rub. No gallop.  Pulmonary:     Effort: Pulmonary effort is normal. No respiratory distress.     Breath sounds: Normal breath sounds. No wheezing.  Skin:    General: Skin is warm and dry.  Neurological:     Mental Status: She is alert and oriented to person, place, and time.  Psychiatric:        Attention and Perception: Attention normal.        Mood and Affect: Mood is anxious. Affect is tearful.        Speech: Speech normal.        Behavior: Behavior normal.        Thought Content: Thought content normal.        Cognition and Memory: Cognition normal.        Judgment: Judgment normal.     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 Manages by recognizing early signs and sitting down. - Document condition on job application physical form.  Recurrent Urinary Tract Infection (UTI) Recurrent UTIs with unresolved symptoms. Possible mycoplasma or ureaplasma infection considered. - Order urine analysis and culture. - Perform self-swab wet prep for bacterial vaginosis. - Test for mycoplasma and ureaplasma infections.  Chronic Pain Significant discomfort after discontinuing previous  medication. Current medications ineffective. Interested in non-narcotic options. - Refer to new pain management clinic. - Consider non-narcotic options such as amitriptyline .  Insomnia Difficulty sleeping, previous medications not tolerated. Interested in amitriptyline  for sleep and pain management. - Prescribe amitriptyline  for sleep management.  Psychiatric Concerns Struggles with mental health, unable to access psychiatric care due to insurance. No self-harm plan,  needs support. - Refer to psychiatry for evaluation and management. - Restart Vyvanse  at 40 mg for ADHD management.  General Health Maintenance Undergoing physical examination for job application. Discussed Quantiferon test for TB screening. - Perform Quantiferon blood test for tuberculosis screening.  Follow-up Requires follow-up to evaluate treatment efficacy and manage ongoing health issues. - Schedule follow-up appointment in four weeks to assess response to new medications and referrals.      Return in about 4 weeks (around 10/06/2023) for sleep/ADHD follow up.  __________________________________ Zada FREDRIK Palin, DNP, APRN, FNP-BC Primary Care and Sports Medicine [facility]

## 2023-09-09 ENCOUNTER — Ambulatory Visit: Payer: Self-pay | Admitting: Medical-Surgical

## 2023-09-09 DIAGNOSIS — N3000 Acute cystitis without hematuria: Secondary | ICD-10-CM

## 2023-09-09 LAB — WET PREP FOR TRICH, YEAST, CLUE
Clue Cell Exam: NEGATIVE
Trichomonas Exam: NEGATIVE
Yeast Exam: NEGATIVE

## 2023-09-09 LAB — SPECIMEN STATUS REPORT

## 2023-09-12 ENCOUNTER — Encounter: Payer: Self-pay | Admitting: Medical-Surgical

## 2023-09-12 LAB — CMP14+EGFR
ALT: 13 IU/L (ref 0–32)
AST: 14 IU/L (ref 0–40)
Albumin: 4.4 g/dL (ref 3.9–4.9)
Alkaline Phosphatase: 76 IU/L (ref 44–121)
BUN/Creatinine Ratio: 15 (ref 9–23)
BUN: 11 mg/dL (ref 6–20)
Bilirubin Total: 0.3 mg/dL (ref 0.0–1.2)
CO2: 19 mmol/L — ABNORMAL LOW (ref 20–29)
Calcium: 9 mg/dL (ref 8.7–10.2)
Chloride: 106 mmol/L (ref 96–106)
Creatinine, Ser: 0.74 mg/dL (ref 0.57–1.00)
Globulin, Total: 2.6 g/dL (ref 1.5–4.5)
Glucose: 93 mg/dL (ref 70–99)
Potassium: 4.2 mmol/L (ref 3.5–5.2)
Sodium: 141 mmol/L (ref 134–144)
Total Protein: 7 g/dL (ref 6.0–8.5)
eGFR: 108 mL/min/1.73 (ref >59)

## 2023-09-12 LAB — LIPID PANEL
Chol/HDL Ratio: 2.9 ratio (ref 0.0–4.4)
Cholesterol, Total: 150 mg/dL (ref 100–199)
HDL: 52 mg/dL (ref >39)
LDL Chol Calc (NIH): 83 mg/dL (ref 0–99)
Triglycerides: 78 mg/dL (ref 0–149)
VLDL Cholesterol Cal: 15 mg/dL (ref 5–40)

## 2023-09-12 LAB — URINE CULTURE

## 2023-09-12 LAB — CBC
Hematocrit: 39.5 % (ref 34.0–46.6)
Hemoglobin: 13.4 g/dL (ref 11.1–15.9)
MCH: 32.1 pg (ref 26.6–33.0)
MCHC: 33.9 g/dL (ref 31.5–35.7)
MCV: 95 fL (ref 79–97)
Platelets: 268 x10E3/uL (ref 150–450)
RBC: 4.17 x10E6/uL (ref 3.77–5.28)
RDW: 12.9 % (ref 11.7–15.4)
WBC: 4.9 x10E3/uL (ref 3.4–10.8)

## 2023-09-12 LAB — QUANTIFERON-TB GOLD PLUS
QuantiFERON Mitogen Value: >10 [IU]/mL
QuantiFERON Nil Value: 0.07 [IU]/mL
QuantiFERON TB1 Ag Value: 0.06 [IU]/mL
QuantiFERON TB2 Ag Value: 0.04 [IU]/mL
QuantiFERON-TB Gold Plus: NEGATIVE

## 2023-09-12 MED ORDER — NITROFURANTOIN MONOHYD MACRO 100 MG PO CAPS: 100.0000 mg | ORAL_CAPSULE | Freq: Two times a day (BID) | ORAL | 0 refills | Status: DC

## 2023-09-12 NOTE — Telephone Encounter (Signed)
Result is back.

## 2023-09-20 ENCOUNTER — Encounter: Payer: Self-pay | Admitting: Medical-Surgical

## 2023-09-21 NOTE — Telephone Encounter (Signed)
 Attempted call to number listed in current patient chart. Voice mail not yet set up . Could not leave a voice mail message.

## 2023-09-26 ENCOUNTER — Other Ambulatory Visit: Payer: Self-pay | Admitting: Medical Genetics

## 2023-09-26 ENCOUNTER — Other Ambulatory Visit: Payer: Self-pay

## 2023-09-26 ENCOUNTER — Other Ambulatory Visit: Payer: Self-pay | Admitting: Medical-Surgical

## 2023-09-26 DIAGNOSIS — M797 Fibromyalgia: Secondary | ICD-10-CM

## 2023-09-26 MED ORDER — PREGABALIN 150 MG PO CAPS: 150.0000 mg | ORAL_CAPSULE | Freq: Three times a day (TID) | ORAL | 2 refills | Status: DC

## 2023-09-28 ENCOUNTER — Other Ambulatory Visit: Payer: MEDICAID

## 2023-09-28 ENCOUNTER — Encounter: Payer: Self-pay | Admitting: Medical-Surgical

## 2023-10-03 ENCOUNTER — Other Ambulatory Visit: Payer: MEDICAID

## 2023-10-04 ENCOUNTER — Other Ambulatory Visit: Payer: MEDICAID

## 2023-10-04 NOTE — Progress Notes (Unsigned)
        Established patient visit   History of Present Illness   Discussed the use of AI scribe software for clinical note transcription with the patient, who gave verbal consent to proceed.  History of Present Illness            Physical Exam   Physical Exam  Assessment & Plan   Assessment and Plan               Follow up   No follow-ups on file.  __________________________________ Zada FREDRIK Palin, DNP, APRN, FNP-BC Primary Care and Sports Medicine [facility] [place]

## 2023-10-05 ENCOUNTER — Encounter: Payer: Self-pay | Admitting: Medical-Surgical

## 2023-10-05 ENCOUNTER — Ambulatory Visit (INDEPENDENT_AMBULATORY_CARE_PROVIDER_SITE_OTHER): Payer: MEDICAID | Admitting: Medical-Surgical

## 2023-10-05 VITALS — BP 109/77 | HR 84 | Resp 20 | Ht 65.0 in | Wt 249.0 lb

## 2023-10-05 DIAGNOSIS — F411 Generalized anxiety disorder: Secondary | ICD-10-CM

## 2023-10-05 DIAGNOSIS — G47419 Narcolepsy without cataplexy: Secondary | ICD-10-CM | POA: Diagnosis not present

## 2023-10-05 DIAGNOSIS — M797 Fibromyalgia: Secondary | ICD-10-CM

## 2023-10-05 DIAGNOSIS — F429 Obsessive-compulsive disorder, unspecified: Secondary | ICD-10-CM

## 2023-10-05 DIAGNOSIS — F410 Panic disorder [episodic paroxysmal anxiety] without agoraphobia: Secondary | ICD-10-CM

## 2023-10-05 DIAGNOSIS — G894 Chronic pain syndrome: Secondary | ICD-10-CM | POA: Diagnosis not present

## 2023-10-05 MED ORDER — LIDOCAINE-PRILOCAINE 2.5-2.5 % EX CREA
TOPICAL_CREAM | Freq: Three times a day (TID) | CUTANEOUS | 3 refills | Status: AC | PRN
Start: 2023-10-05 — End: ?

## 2023-10-05 MED ORDER — AMITRIPTYLINE HCL 50 MG PO TABS: 50.0000 mg | ORAL_TABLET | Freq: Every day | ORAL | 0 refills | Status: DC

## 2023-10-05 MED ORDER — LIDOCAINE 5 % EX PTCH: 1.0000 | MEDICATED_PATCH | CUTANEOUS | 5 refills | Status: AC

## 2023-10-05 MED ORDER — DICLOFENAC SODIUM 1 % EX GEL: CUTANEOUS | 3 refills | Status: AC

## 2023-10-05 MED ORDER — FLUOXETINE HCL 40 MG PO CAPS: 40.0000 mg | ORAL_CAPSULE | Freq: Every day | ORAL | 3 refills | Status: AC

## 2023-10-05 NOTE — Patient Instructions (Signed)
 [employer] -

## 2023-10-06 ENCOUNTER — Other Ambulatory Visit (HOSPITAL_COMMUNITY): Payer: Self-pay

## 2023-10-06 ENCOUNTER — Telehealth: Payer: Self-pay

## 2023-10-06 ENCOUNTER — Encounter: Payer: Self-pay | Admitting: Medical-Surgical

## 2023-10-06 DIAGNOSIS — M797 Fibromyalgia: Secondary | ICD-10-CM

## 2023-10-06 NOTE — Telephone Encounter (Signed)
 Pharmacy Patient Advocate Encounter   Received notification from RX Request Messages that prior authorization for Lidocaine  5% patches is required/requested.   Insurance verification completed.   The patient is insured through [facility] .   Per test claim: PA required; PA submitted to above mentioned insurance via Latent Key/confirmation #/EOC [id number] Status is pending

## 2023-10-07 ENCOUNTER — Other Ambulatory Visit (HOSPITAL_COMMUNITY): Payer: Self-pay

## 2023-10-07 NOTE — Telephone Encounter (Signed)
 Pharmacy Patient Advocate Encounter  Received notification from [employer] that Prior Authorization for Lidocaine  5% patches has been APPROVED from 10/07/23 to 10/06/24. Ran test claim, Copay is $4. This test claim was processed through [facility]- copay amounts may vary at other pharmacies due to pharmacy/plan contracts, or as the patient moves through the different stages of their insurance plan.   PA #/Case ID/Reference #: [id number]  Placed a call to [facility] to notify of the approval.

## 2023-10-10 ENCOUNTER — Other Ambulatory Visit (HOSPITAL_COMMUNITY): Payer: Self-pay

## 2023-10-10 ENCOUNTER — Other Ambulatory Visit: Payer: MEDICAID

## 2023-10-18 ENCOUNTER — Encounter: Payer: Self-pay | Admitting: Sports Medicine

## 2023-10-24 ENCOUNTER — Other Ambulatory Visit: Payer: Self-pay | Admitting: "Endocrinology

## 2023-10-25 ENCOUNTER — Other Ambulatory Visit: Payer: MEDICAID

## 2023-11-02 ENCOUNTER — Encounter: Payer: Self-pay | Admitting: Medical-Surgical

## 2023-11-02 MED ORDER — VYVANSE 40 MG PO CAPS: 40.0000 mg | ORAL_CAPSULE | Freq: Every morning | ORAL | 0 refills | Status: DC

## 2023-11-02 NOTE — Telephone Encounter (Signed)
 Requesting rx rf of Vyvanse  Last written 09/08/2023 Last OV 10/05/2023 Upcoming appt = none

## 2023-11-07 ENCOUNTER — Other Ambulatory Visit: Payer: MEDICAID

## 2023-11-17 ENCOUNTER — Encounter: Payer: Self-pay | Admitting: Medical-Surgical

## 2023-11-30 ENCOUNTER — Other Ambulatory Visit: Payer: Self-pay | Admitting: Medical Genetics

## 2023-11-30 DIAGNOSIS — Z006 Encounter for examination for normal comparison and control in clinical research program: Secondary | ICD-10-CM

## 2023-12-05 ENCOUNTER — Ambulatory Visit: Payer: MEDICAID | Admitting: Diagnostic Neuroimaging

## 2023-12-08 ENCOUNTER — Encounter: Payer: Self-pay | Admitting: Gastroenterology

## 2023-12-18 ENCOUNTER — Encounter: Payer: Self-pay | Admitting: Medical-Surgical

## 2023-12-18 IMAGING — CT CT RENAL STONE PROTOCOL
2 of 4 series · 14 of 46 positions shown, 16 images · non-contrast
Comparison: None.
COMPARISON: None.

Addendum:
CLINICAL DATA: Flank pain. Kidney stones suspected. Recently
treated with Macrobid for urinary tract infection. Mostly left-sided
pain. History of stones.



[Series 2: axial st · axial · 0.72mm/px · z∈[-490,-90]mm · 11 of 97 slices shown, 13 images]
[im 9/97  soft-tissue]
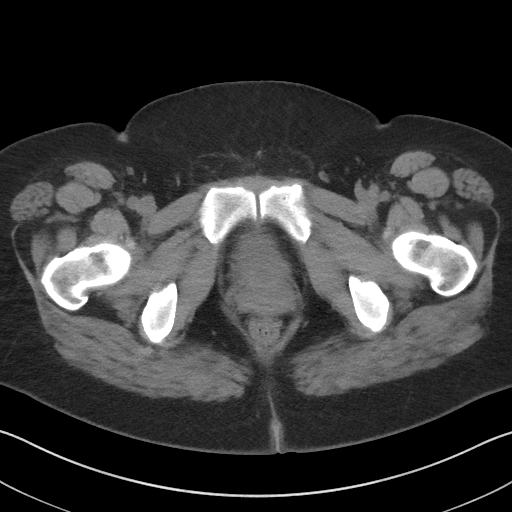
[im 9/97  bone]
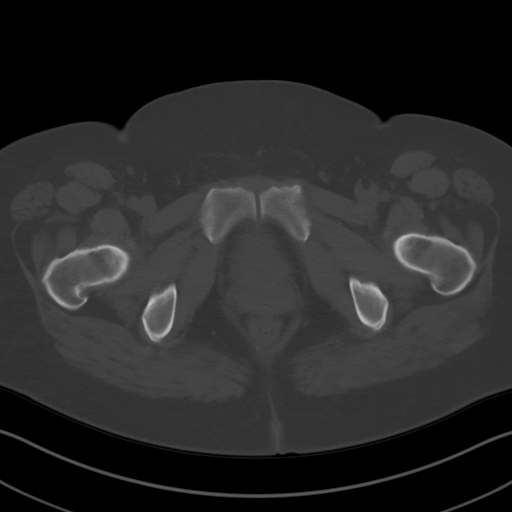
[im 17/97  soft-tissue]
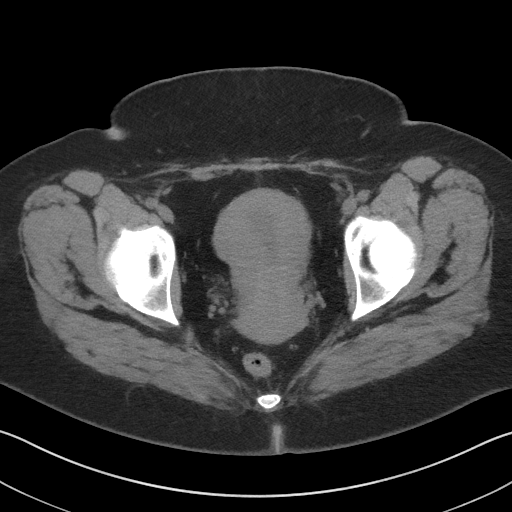
[im 25/97  soft-tissue]
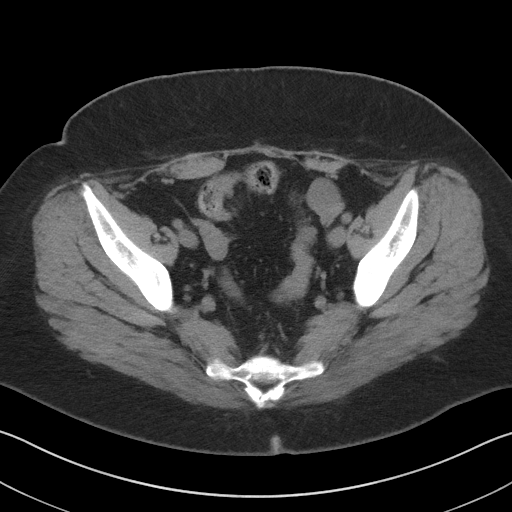
[im 33/97  soft-tissue]
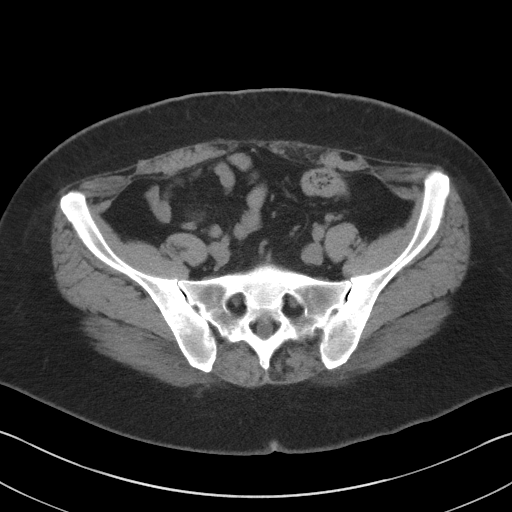
[im 41/97  soft-tissue]
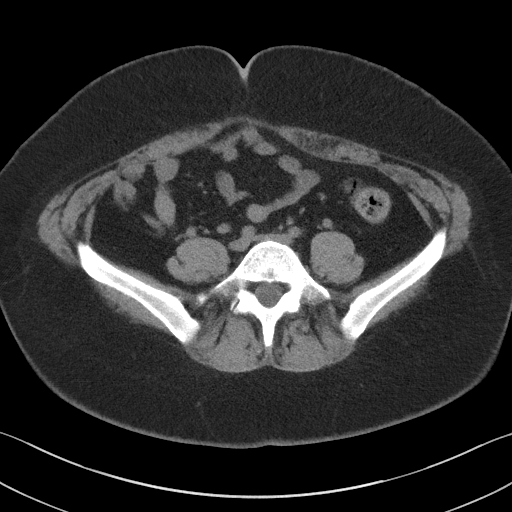
[im 49/97  soft-tissue]
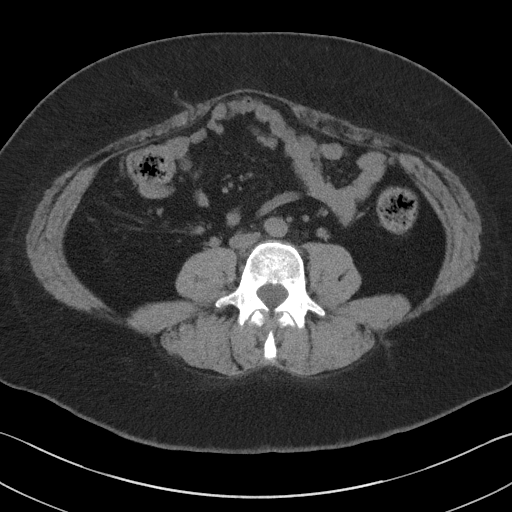
[im 57/97  soft-tissue]
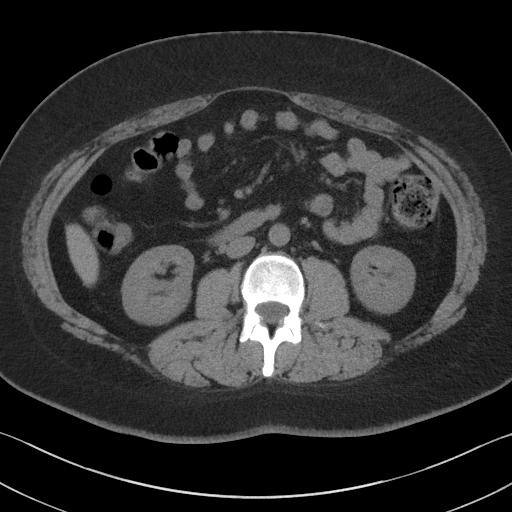
[im 65/97  soft-tissue]
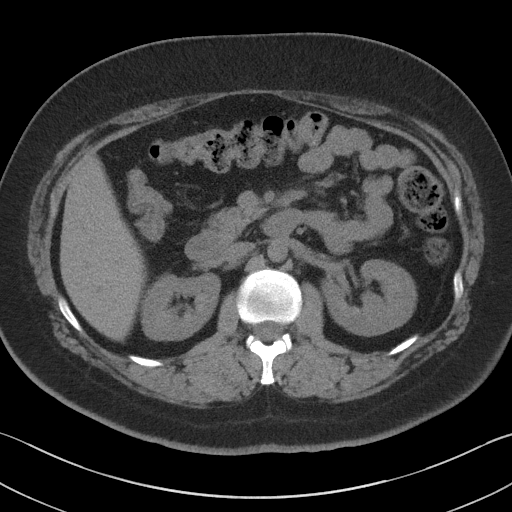
[im 73/97  soft-tissue]
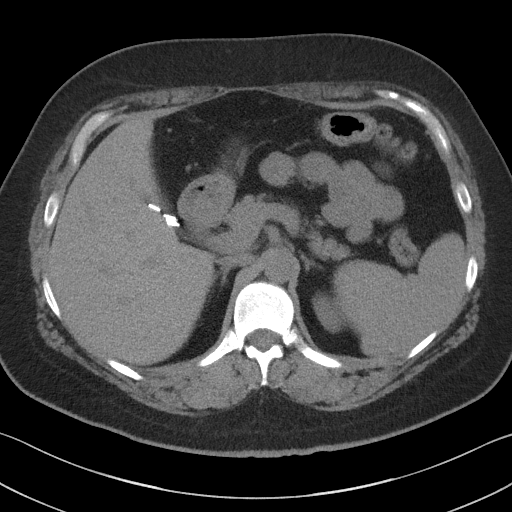
[im 73/97  bone]
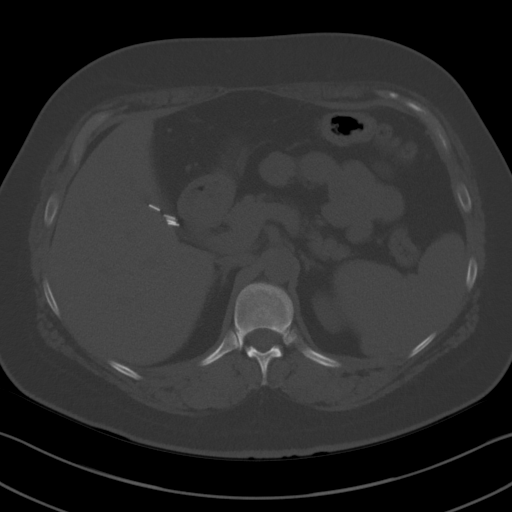
[im 81/97  soft-tissue]
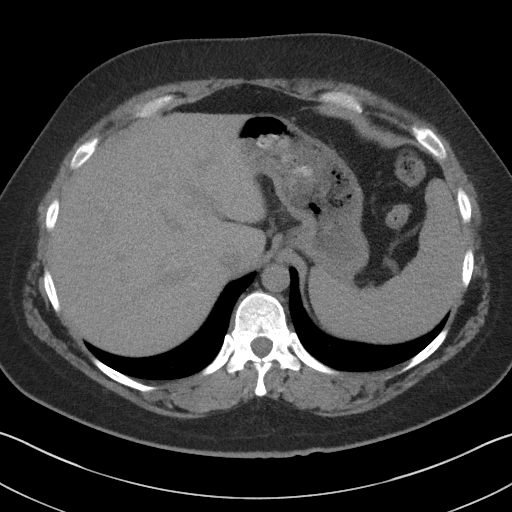
[im 89/97  soft-tissue]
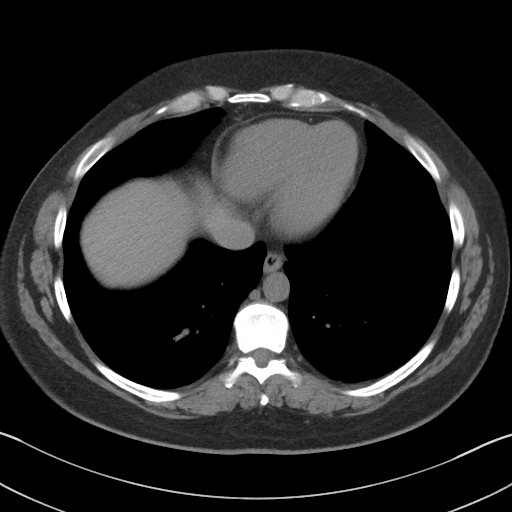

[Series 4: coronal st · coronal · 0.71mm/px · 3 of 88 slices shown]
[im 30/88  soft-tissue]
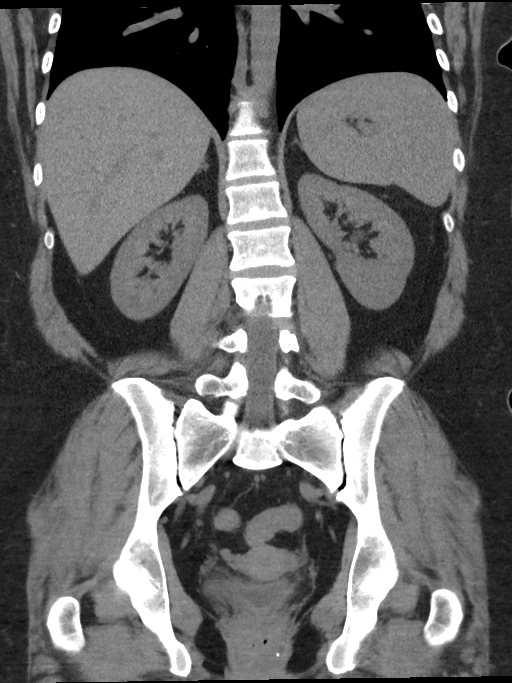
[im 39/88  soft-tissue]
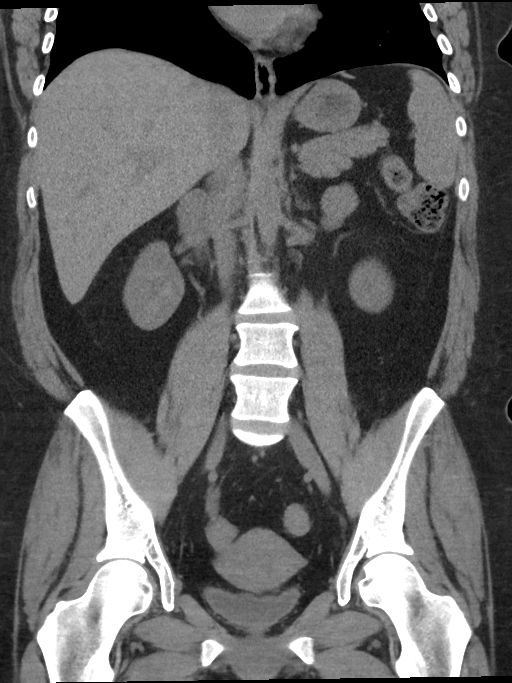
[im 49/88  soft-tissue]
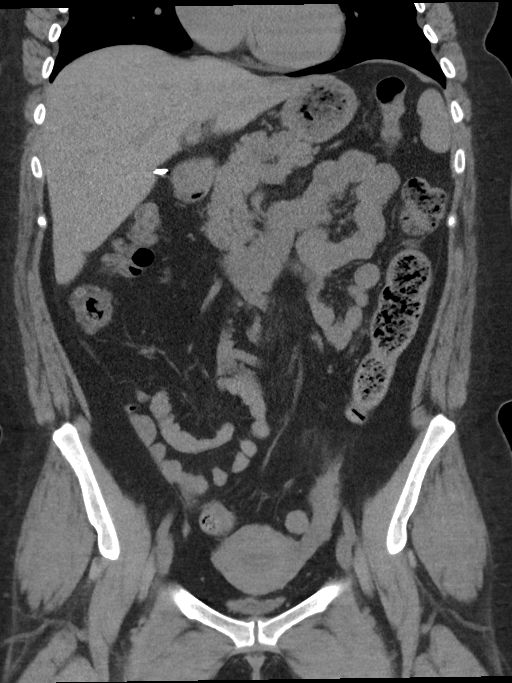

[14 of 46 positions shown; findings below may reference images not displayed]

FINDINGS: Lower chest: Unremarkable.

Lack of intra-articular fluid limits evaluation of the abdominal and
pelvic organ parenchyma. The following findings are made within this
limitation.

Hepatobiliary: Smooth liver contours. No gross liver lesion is seen.
Status post cholecystectomy.

Pancreas: Grossly unremarkable. No gross pancreatic ductal
dilatation or surrounding inflammatory changes.

Spleen: Spleen measures up to 13.6 cm in AP dimension, mildly
enlarged.

Adrenals/Urinary Tract: Normal adrenals. No hydronephrosis. No stone
is seen within either ureter either kidney or the urinary bladder.
110 mm left upper pole and 12 mm left lower pole low-density lesions
are too small to further characterize although statistically most
likely representing cysts. Renal ultrasound would be necessary if
further characterization is needed. The urinary bladder is only
mildly distended and grossly unremarkable.

Stomach/Bowel: No bowel wall thickening. The terminal ileum is
unremarkable. The appendix is within normal limits. No dilated loops
of bowel to indicate bowel obstruction.

Vascular/Lymphatic: No abdominal aortic aneurysm. No enlarged
abdominal or pelvic lymph nodes.

Reproductive: Uterus and bilateral adnexae are unremarkable.

Other: Small fat containing umbilical hernia. No free air or free
fluid.

Musculoskeletal: Mild levocurvature centered at L3.
IMPRESSION: :
IMPRESSION: 1. No nephroureterolithiasis or hydronephrosis.
2. Status post cholecystectomy.
3. No bowel obstruction.
4. Small fat containing umbilical hernia.

ADDENDUM:
There was a Dictaphone air in the adrenal/urinary tract section. The
fourth sentence should read:

"10 mm left upper pole and 12 mm left lower pole low-density lesions
are too small to further characterize although statistically
mostlikely representing cysts."

This was discussed with the ordering clinician joint Khairullin up at
4539 hours on 04/06/2021.

*** End of Addendum ***
FINDINGS: Lower chest: Unremarkable.

Lack of intra-articular fluid limits evaluation of the abdominal and
pelvic organ parenchyma. The following findings are made within this
limitation.

Hepatobiliary: Smooth liver contours. No gross liver lesion is seen.
Status post cholecystectomy.

Pancreas: Grossly unremarkable. No gross pancreatic ductal
dilatation or surrounding inflammatory changes.

Spleen: Spleen measures up to 13.6 cm in AP dimension, mildly
enlarged.

Adrenals/Urinary Tract: Normal adrenals. No hydronephrosis. No stone
is seen within either ureter either kidney or the urinary bladder.
110 mm left upper pole and 12 mm left lower pole low-density lesions
are too small to further characterize although statistically most
likely representing cysts. Renal ultrasound would be necessary if
further characterization is needed. The urinary bladder is only
mildly distended and grossly unremarkable.

Stomach/Bowel: No bowel wall thickening. The terminal ileum is
unremarkable. The appendix is within normal limits. No dilated loops
of bowel to indicate bowel obstruction.

Vascular/Lymphatic: No abdominal aortic aneurysm. No enlarged
abdominal or pelvic lymph nodes.

Reproductive: Uterus and bilateral adnexae are unremarkable.

Other: Small fat containing umbilical hernia. No free air or free
fluid.

Musculoskeletal: Mild levocurvature centered at L3.
IMPRESSION: :
IMPRESSION: 1. No nephroureterolithiasis or hydronephrosis.
2. Status post cholecystectomy.
3. No bowel obstruction.
4. Small fat containing umbilical hernia.

## 2023-12-19 MED ORDER — VYVANSE 40 MG PO CAPS: 40.0000 mg | ORAL_CAPSULE | Freq: Every morning | ORAL | 0 refills | Status: DC

## 2023-12-21 ENCOUNTER — Encounter: Payer: Self-pay | Admitting: Medical-Surgical

## 2023-12-21 DIAGNOSIS — M549 Dorsalgia, unspecified: Secondary | ICD-10-CM

## 2023-12-30 ENCOUNTER — Encounter: Payer: Self-pay | Admitting: Medical-Surgical

## 2024-01-04 ENCOUNTER — Other Ambulatory Visit: Payer: Self-pay | Admitting: Medical-Surgical

## 2024-01-04 DIAGNOSIS — K219 Gastro-esophageal reflux disease without esophagitis: Secondary | ICD-10-CM

## 2024-01-25 ENCOUNTER — Other Ambulatory Visit (INDEPENDENT_AMBULATORY_CARE_PROVIDER_SITE_OTHER): Payer: MEDICAID

## 2024-01-25 ENCOUNTER — Ambulatory Visit (INDEPENDENT_AMBULATORY_CARE_PROVIDER_SITE_OTHER): Payer: MEDICAID | Admitting: Gastroenterology

## 2024-01-25 ENCOUNTER — Encounter: Payer: Self-pay | Admitting: Gastroenterology

## 2024-01-25 VITALS — BP 122/76 | HR 97 | Ht 66.0 in | Wt 253.0 lb

## 2024-01-25 DIAGNOSIS — K580 Irritable bowel syndrome with diarrhea: Secondary | ICD-10-CM | POA: Diagnosis not present

## 2024-01-25 DIAGNOSIS — K219 Gastro-esophageal reflux disease without esophagitis: Secondary | ICD-10-CM | POA: Diagnosis not present

## 2024-01-25 DIAGNOSIS — R110 Nausea: Secondary | ICD-10-CM

## 2024-01-25 DIAGNOSIS — R1084 Generalized abdominal pain: Secondary | ICD-10-CM

## 2024-01-25 DIAGNOSIS — K915 Postcholecystectomy syndrome: Secondary | ICD-10-CM | POA: Diagnosis not present

## 2024-01-25 DIAGNOSIS — R197 Diarrhea, unspecified: Secondary | ICD-10-CM

## 2024-01-25 DIAGNOSIS — Z9049 Acquired absence of other specified parts of digestive tract: Secondary | ICD-10-CM

## 2024-01-25 MED ORDER — CHOLESTYRAMINE 4 G PO PACK: PACK | ORAL | 1 refills | Status: AC

## 2024-01-25 NOTE — Progress Notes (Signed)
 Discussed the use of AI scribe software for clinical note transcription with the patient, who gave verbal consent to proceed.  HPI : Beth Lane is a 38 year old female who presents with chronic nausea and vomiting. She is accompanied by her wife. She was referred for evaluation of chronic nausea and vomiting.  She experiences chronic nausea daily, persistent since 02/14/22, with a significant exacerbation about eight months ago. The nausea is constant, with less frequent episodes of vomiting, the last occurring approximately two months ago. The nausea worsens with meals and is accompanied by sharp right-sided abdominal pain and swelling after eating.  The abdominal swelling seems to localize to the left upper quadrant  She has a history of chronic gastrointestinal symptoms, including watery stools, lower abdominal pain, and bright red blood per rectum. A CT scan in April 2019 showed a large amount of stool throughout the colon but was otherwise normal. A colonoscopy at age 63 to evaluate rectal bleeding revealed hemorrhoids, which she was told were thought to be the source of her bleeding. She reports difficulty with the sedation during the colonoscopy.  She has not undergone an upper endoscopy due to anxiety about the procedure.  A CT in May of this year did not show any abnormalities to explain her GI symptoms.  She experiences frequent diarrhea, typically three to four bowel movements per day, with occasional constipation lasting up to a week. She attributes some diarrhea to the absence of her gallbladder, removed in 02-15-2007. She has not used bile acid binders like cholestyramine.  She suffers from persistent heartburn, managed with Nexium , experiencing daily heartburn that is less severe with medication. Her weight fluctuates between 250 and 256 pounds, with a recent weight loss of ten pounds, which has since been regained.     Although she was on chronic opioids for back  pain, she has been able to wean off narcotics as of a few months ago.    CT Abd/pelvis May 2025 IMPRESSION: *No acute abnormality seen in the abdomen or pelvis. *1 mm calcification in the right kidney midpole calices without hydronephrosis or ureterolithiasis. *Status post cholecystectomy and hysterectomy.   Past Medical History:  Diagnosis Date   Allergy    Imatrex and plastic tape   Arrhythmia 02-15-07   Arthritis    In knees, back, wrist, neck   Asthma    At age 33   Chronic kidney disease Early signs of this. Repeated kidney infections and UTIs   Chronic pain    Clotting disorder    Rectal bleeding at times, hemroids, ulcer   COPD (chronic obstructive pulmonary disease) (HCC)    Since 20   Depression    At age 69 but worse after my son died in 02/14/2010   Fibromyalgia    Generalized anxiety disorder    GERD (gastroesophageal reflux disease)    Hypertension    About 4 years ago   Migraine syndrome    Mixed hyperlipidemia 12/09/2022   Myocardial infarction (HCC) 03/08/2023   Had septal infarct   Osteoporosis    I tend to fracture bones easily and doctor wanted a bone density test in 02/15/11 but found out I was pregnant and was never followed up with   PTSD (post-traumatic stress disorder)    Scoliosis    Stroke (HCC)    Body reacted as a stroke but not an actual stroke. 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 Lost feeling and control on right side  Syncope and collapse 2008   Started in 2008 while pregnant due to stress and continued for years. Stopped for about 4 years and has recently started back 2 years ago   Ulcer      Past Surgical History:  Procedure Laterality Date   CESAREAN SECTION     CHOLECYSTECTOMY     TUBAL LIGATION     Family History  Problem Relation Age of Onset   Hypertension Mother    Cancer Mother    Bipolar disorder Mother    Fibromyalgia Mother    ADD / ADHD Mother    Anxiety disorder Mother    Arthritis Mother    COPD Mother     Depression Mother    Diabetes Mother    Obesity Mother    Alcohol abuse Mother    Asthma Mother    Kidney disease Mother    Stroke Father    Post-traumatic stress disorder Father    Heart attack Father    ADD / ADHD Father    Anxiety disorder Father    Asthma Father    Heart disease Father    Hypertension Father    Alcohol abuse Maternal Grandfather    ADD / ADHD Daughter    ADD / ADHD Son    Anxiety disorder Son    ADD / ADHD Brother    Learning disabilities Brother    ADD / ADHD Sister    Anxiety disorder Son    Intellectual disability Son    Anxiety disorder Son    Hearing loss Son    Learning disabilities Son    Early death Son    Learning disabilities Son    Miscarriages / Stillbirths Sister    Early death Maternal Uncle    Heart attack Maternal Uncle    Social History   Tobacco Use   Smoking status: Former    Types: E-cigarettes   Smokeless tobacco: Never  Vaping Use   Vaping status: Every Day  Substance Use Topics   Alcohol use: Never   Drug use: Never   Current Outpatient Medications  Medication Sig Dispense Refill   albuterol  (PROVENTIL ) (2.5 MG/3ML) 0.083% nebulizer solution Take 3 mLs (2.5 mg total) by nebulization every 6 (six) hours as needed for wheezing or shortness of breath. 150 mL 1   Aluminum & Magnesium  Hydroxide (MAGNESIUM -ALUMINUM PO) Take by mouth.     blood glucose meter kit and supplies KIT Dispense based on patient and insurance preference. Use up to four times daily as directed. Please include lancets, test strips, control solution. 1 each 0   diclofenac  Sodium (VOLTAREN ) 1 % GEL Apply 2 Grams topically to affected areas 3 times daily as needed 50 g 3   esomeprazole  (NEXIUM ) 40 MG capsule NEEDS APPOINTMENT FOR FURTHER REFILLS. TAKE 1 CAPSULE BY MOUTH DAILY AT NOON 90 capsule 0   FLUoxetine  (PROZAC ) 40 MG capsule Take 1 capsule (40 mg total) by mouth daily. 90 capsule 3   fluticasone -salmeterol (ADVAIR DISKUS) 250-50 MCG/ACT AEPB Inhale  1 puff into the lungs 2 (two) times daily. in the morning and at bedtime. 180 each 1   Lido-Capsaicin -Men-Methyl Sal (1ST MEDX-PATCH/ LIDOCAINE ) 4-0.025-5-20 % PTCH Apply topically as instructed. 10 patch 3   lidocaine  (LIDODERM ) 5 % Place 1 patch onto the skin daily. 30 patch 5   lidocaine -prilocaine  (EMLA ) cream Apply topically 3 (three) times daily as needed. SMARTSIG:2 Gram(s) Topical 3 Times Daily PRN Strength: 2.5-2.5 % 30 g 3   methocarbamol  (ROBAXIN ) 750 MG  tablet TAKE 1 TABLET BY MOUTH EVERY 6 HOURS AS NEEDED 360 tablet 0   ondansetron  (ZOFRAN -ODT) 4 MG disintegrating tablet Take 1 tablet (4 mg total) by mouth every 8 (eight) hours as needed for nausea or vomiting. 20 tablet 0   prazosin  (MINIPRESS ) 1 MG capsule TAKE 3 CAPSULES(3 MG) BY MOUTH AT BEDTIME 270 capsule 1   pregabalin  (LYRICA ) 150 MG capsule Take 1 capsule (150 mg total) by mouth 3 (three) times daily. 90 capsule 2   prochlorperazine  (COMPAZINE ) 10 MG tablet Take 1 tablet (10 mg total) by mouth every 6 (six) hours as needed for nausea or vomiting. 30 tablet 0   Respiratory Therapy Supplies (FULL KIT NEBULIZER SET) MISC Nebulizer machine of patient's choice, please include tubes and hoses as needed.  Use as directed. 1 each 0   rosuvastatin  (CRESTOR ) 5 MG tablet TAKE 1 TABLET BY MOUTH EVERY DAY AT BEDTIME FOR CHOLESTEROL 90 tablet 0   VYVANSE  40 MG capsule Take 1 capsule (40 mg total) by mouth every morning. 30 capsule 0   No current facility-administered medications for this visit.   Allergies  Allergen Reactions   Sumatriptan Anaphylaxis   Barium Sulfate Nausea And Vomiting   Tape Rash     Review of Systems: All systems reviewed and negative except where noted in HPI.    No results found.  Physical Exam: BP 122/76   Pulse 97   Ht 5' 6 (1.676 m)   Wt 253 lb (114.8 kg)   LMP 12/21/2023   SpO2 98%   BMI 40.84 kg/m  Constitutional: Pleasant,well-developed, Caucasian female in no acute distress.  Accompanied by  spouse HEENT: Normocephalic and atraumatic. Conjunctivae are normal. No scleral icterus. Neck supple.  Cardiovascular: Normal rate, regular rhythm.  Pulmonary/chest: Effort normal and breath sounds normal. No wheezing, rales or rhonchi. Abdominal: Soft, nondistended, multifocal tenderness to palpation . Bowel sounds active throughout. There are no masses palpable. No hepatomegaly. Extremities: no edema Lymphadenopathy: No cervical adenopathy noted. Neurological: Alert and oriented to person place and time. Skin: Skin is warm and dry. No rashes noted. Psychiatric: Normal mood and affect. Behavior is normal.  CBC    Component Value Date/Time   WBC 4.9 09/08/2023 1009   WBC 5.2 03/14/2023 1256   RBC 4.17 09/08/2023 1009   RBC 3.92 03/14/2023 1256   HGB 13.4 09/08/2023 1009   HCT 39.5 09/08/2023 1009   PLT 268 09/08/2023 1009   MCV 95 09/08/2023 1009   MCH 32.1 09/08/2023 1009   MCH 32.4 03/14/2023 1256   MCHC 33.9 09/08/2023 1009   MCHC 35.9 03/14/2023 1256   RDW 12.9 09/08/2023 1009   LYMPHSABS 2.0 06/20/2023 1549   MONOABS 0.4 06/01/2021 2051   EOSABS 0.1 06/20/2023 1549   BASOSABS 0.0 06/20/2023 1549    CMP     Component Value Date/Time   NA 141 09/08/2023 1009   K 4.2 09/08/2023 1009   CL 106 09/08/2023 1009   CO2 19 (L) 09/08/2023 1009   GLUCOSE 93 09/08/2023 1009   GLUCOSE 91 03/14/2023 1256   BUN 11 09/08/2023 1009   CREATININE 0.74 09/08/2023 1009   CREATININE 0.77 06/28/2022 1125   CALCIUM  9.0 09/08/2023 1009   PROT 7.0 09/08/2023 1009   ALBUMIN 4.4 09/08/2023 1009   AST 14 09/08/2023 1009   ALT 13 09/08/2023 1009   ALKPHOS 76 09/08/2023 1009   BILITOT 0.3 09/08/2023 1009   GFRNONAA >60 03/14/2023 1256       Latest Ref Rng &  Units 09/08/2023   10:09 AM 06/20/2023    3:49 PM 03/14/2023   12:56 PM  CBC EXTENDED  WBC 3.4 - 10.8 x10E3/uL 4.9  4.2  5.2   RBC 3.77 - 5.28 x10E6/uL 4.17  4.15  3.92   Hemoglobin 11.1 - 15.9 g/dL 86.5  86.7  87.2   HCT 34.0 -  46.6 % 39.5  37.7  35.4   Platelets 150 - 450 x10E3/uL 268  306  247   NEUT# 1.4 - 7.0 x10E3/uL  1.7    Lymph# 0.7 - 3.1 x10E3/uL  2.0        ASSESSMENT AND PLAN:   Irritable bowel syndrome with diarrhea Longstanding chronic GI symptoms of abdominal pain and diarrhea consistent with IBS, exacerbated by diet. Differential includes celiac disease and alpha-gal syndrome.  There may also be some bile acid diarrhea contributing, given her CCY. - Prescribed cholestyramine, starting once daily, increase to twice daily if tolerated. - Recommended low FODMAP diet for four weeks. - Ordered blood tests for celiac disease and alpha-gal syndrome. - Advised Metamucil capsules for fiber.  Postcholecystectomy syndrome Chronic diarrhea may be related to increased bile post-cholecystectomy. - Prescribed cholestyramine for diarrhea.  Gastroesophageal reflux disease, chronic nausea Chronic heartburn managed with Nexium . Nausea persistent, less responsive to PPI. Suspect functional etiology vs medication side effect.  - Continue Nexium . - Scheduled upper endoscopy to evaluate for complicated GERD, PUD - Consider gastric emptying study   The details, risks (including bleeding, perforation, infection, missed lesions, medication reactions and possible hospitalization or surgery if complications occur), benefits, and alternatives to EGD with possible biopsy and possible dilation were discussed with the patient and she consents to proceed.    Recording duration: 25 minutes      I spent a total of 40 minutes reviewing the patient's medical record, interviewing and examining the patient, discussing her diagnosis and management of her condition going forward, and documenting in the medical record  Jenesa Foresta E. Stacia, MD [facility]   Willo Mini, NP

## 2024-01-25 NOTE — Patient Instructions (Signed)
 Your provider has requested that you go to the basement level for lab work before leaving today. Press B on the elevator. The lab is located at the first door on the left as you exit the elevator.  Please purchase the following medications over the counter and take as directed: Metamucil daily.   We have sent the following medications to your pharmacy for you to pick up at your convenience: cholestyramine.  You have been scheduled for an endoscopy. Please follow written instructions given to you at your visit today.  If you use inhalers (even only as needed), please bring them with you on the day of your procedure.  If you take any of the following medications, they will need to be adjusted prior to your procedure:   DO NOT TAKE 7 DAYS PRIOR TO TEST- Trulicity (dulaglutide) Ozempic, Wegovy (semaglutide) Mounjaro, Zepbound (tirzepatide) Bydureon Bcise (exanatide extended release)  DO NOT TAKE 1 DAY PRIOR TO YOUR TEST Rybelsus (semaglutide) Adlyxin (lixisenatide) Victoza (liraglutide) Byetta (exanatide) ____________________________________________________________  You have been also given a low-fodmap diet to follow.   _______________________________________________________  If your blood pressure at your visit was 140/90 or greater, please contact your primary care physician to follow up on this.  _______________________________________________________  If you are age 56 or older, your body mass index should be between 23-30. Your Body mass index is 40.84 kg/m. If this is out of the aforementioned range listed, please consider follow up with your Primary Care Provider.  If you are age 36 or younger, your body mass index should be between 19-25. Your Body mass index is 40.84 kg/m. If this is out of the aformentioned range listed, please consider follow up with your Primary Care Provider.   ________________________________________________________  The [facility] providers  would like to encourage you to use MYCHART to communicate with providers for non-urgent requests or questions.  Due to long hold times on the telephone, sending your provider a message by [facility] may be a faster and more efficient way to get a response.  Please allow 48 business hours for a response.  Please remember that this is for non-urgent requests.  _______________________________________________________  [facility] is using a team-based approach to care.  Your team is made up of your doctor and two to three APPS. Our APPS (Nurse Practitioners and Physician Assistants) work with your physician to ensure care continuity for you. They are fully qualified to address your health concerns and develop a treatment plan. They communicate directly with your gastroenterologist to care for you. Seeing the Advanced Practice Practitioners on your physician's team can help you by facilitating care more promptly, often allowing for earlier appointments, access to diagnostic testing, procedures, and other specialty referrals.

## 2024-01-31 LAB — ALPHA-GAL PANEL
Allergen, Mutton, f88: <0.1 kU/L
Allergen, Pork, f26: <0.1 kU/L
Beef: <0.1 kU/L
CLASS: 0
CLASS: 0
Class: 0
GALACTOSE-ALPHA-1,3-GALACTOSE IGE*: <0.1 kU/L (ref <0.10)

## 2024-01-31 LAB — TISSUE TRANSGLUTAMINASE, IGA: (tTG) Ab, IgA: <1 U/mL

## 2024-01-31 LAB — IGA: Immunoglobulin A: 226 mg/dL (ref 47–310)

## 2024-01-31 LAB — INTERPRETATION:

## 2024-02-01 ENCOUNTER — Encounter: Payer: Self-pay | Admitting: Medical-Surgical

## 2024-02-01 MED ORDER — ROSUVASTATIN CALCIUM 5 MG PO TABS: 5.0000 mg | ORAL_TABLET | Freq: Every day | ORAL | 2 refills | Status: AC

## 2024-02-03 NOTE — Progress Notes (Signed)
 [place],  Your tests for celiac disease and alpha gal syndrome were negative (normal).  Will see you soon for your upper endoscopy.

## 2024-02-07 ENCOUNTER — Telehealth: Payer: Self-pay | Admitting: Gastroenterology

## 2024-02-07 ENCOUNTER — Encounter: Payer: MEDICAID | Admitting: Gastroenterology

## 2024-02-07 NOTE — Telephone Encounter (Signed)
 Good Morning Dr. Stacia,  I called this patient this morning at 7:45 am to see if she was coming for her procedure.   I asked her to call if she was running late or needed to reschedule.    I will NO SHOW her.

## 2024-02-11 ENCOUNTER — Encounter: Payer: Self-pay | Admitting: Medical-Surgical

## 2024-02-17 ENCOUNTER — Other Ambulatory Visit: Payer: Self-pay | Admitting: Medical-Surgical

## 2024-02-17 DIAGNOSIS — F410 Panic disorder [episodic paroxysmal anxiety] without agoraphobia: Secondary | ICD-10-CM

## 2024-02-19 ENCOUNTER — Encounter: Payer: Self-pay | Admitting: Medical-Surgical

## 2024-02-20 MED ORDER — VYVANSE 40 MG PO CAPS: 40.0000 mg | ORAL_CAPSULE | Freq: Every morning | ORAL | 0 refills | Status: AC

## 2024-02-28 IMAGING — DX DG HIP (WITH OR WITHOUT PELVIS) 2-3V*L*
3 series · 3 of 3 positions shown · non-contrast
Comparison: None Available.

CLINICAL DATA: Left groin pain.

EXAM:
DG HIP (WITH OR WITHOUT PELVIS) 2-3V LEFT

[pelvis ap]
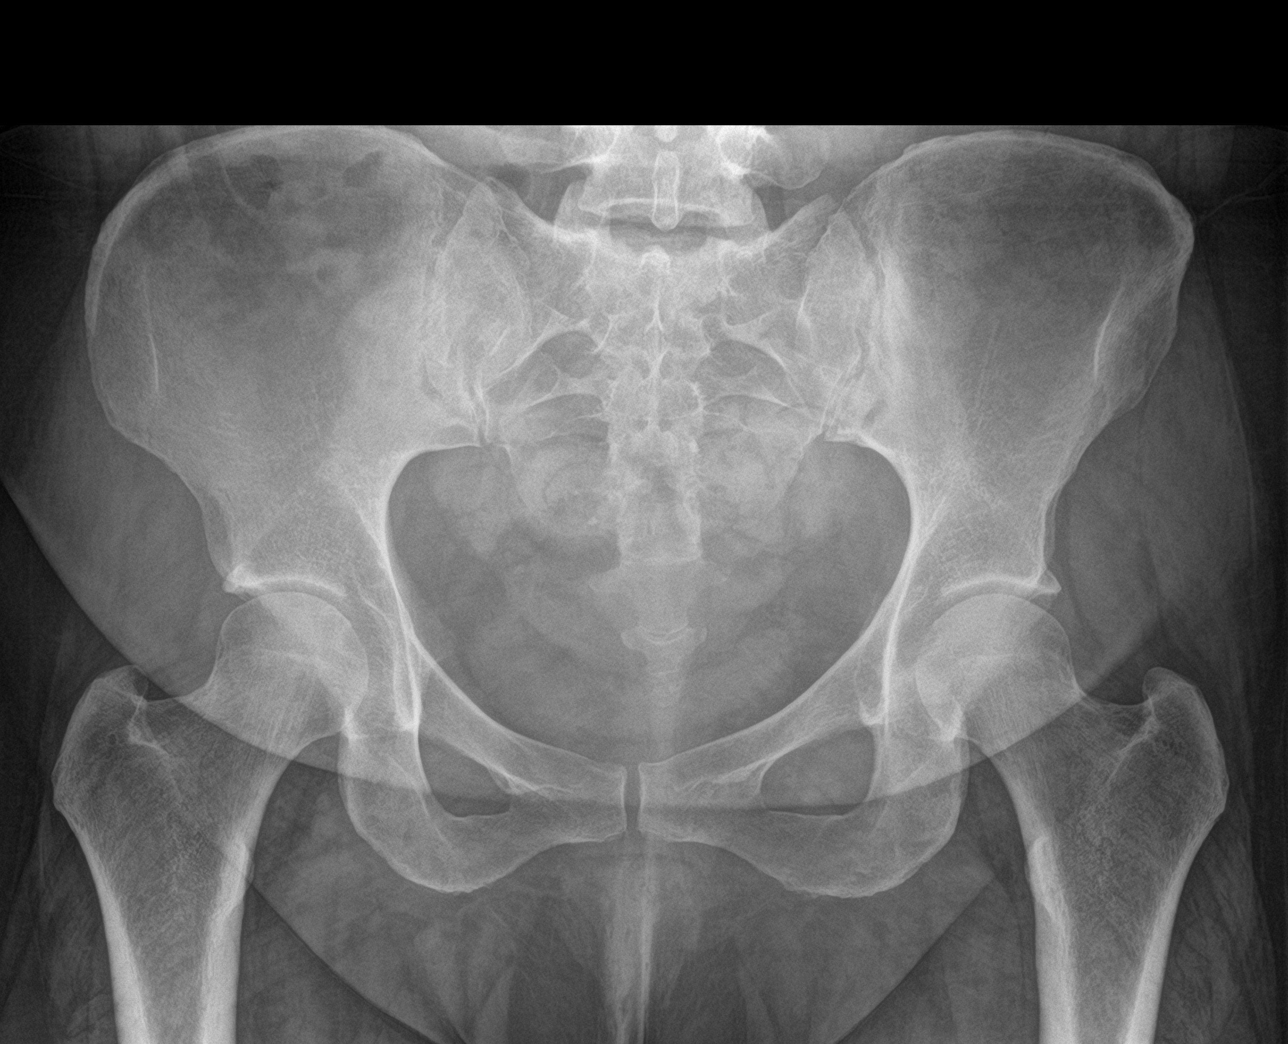

[hip ap]
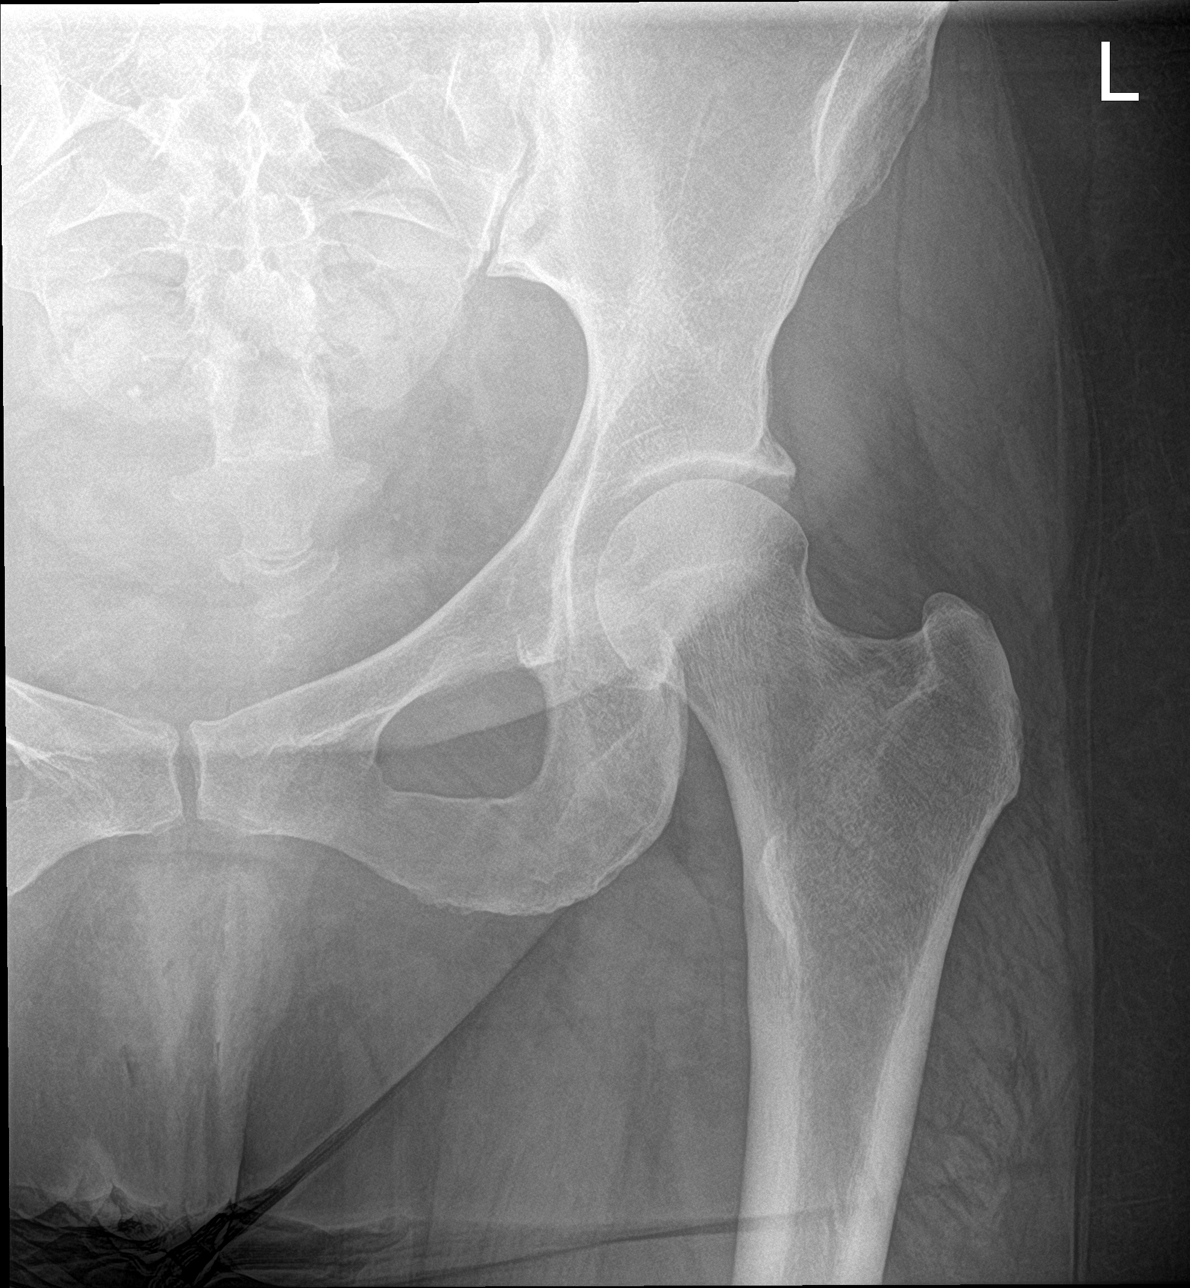

[hip lat]
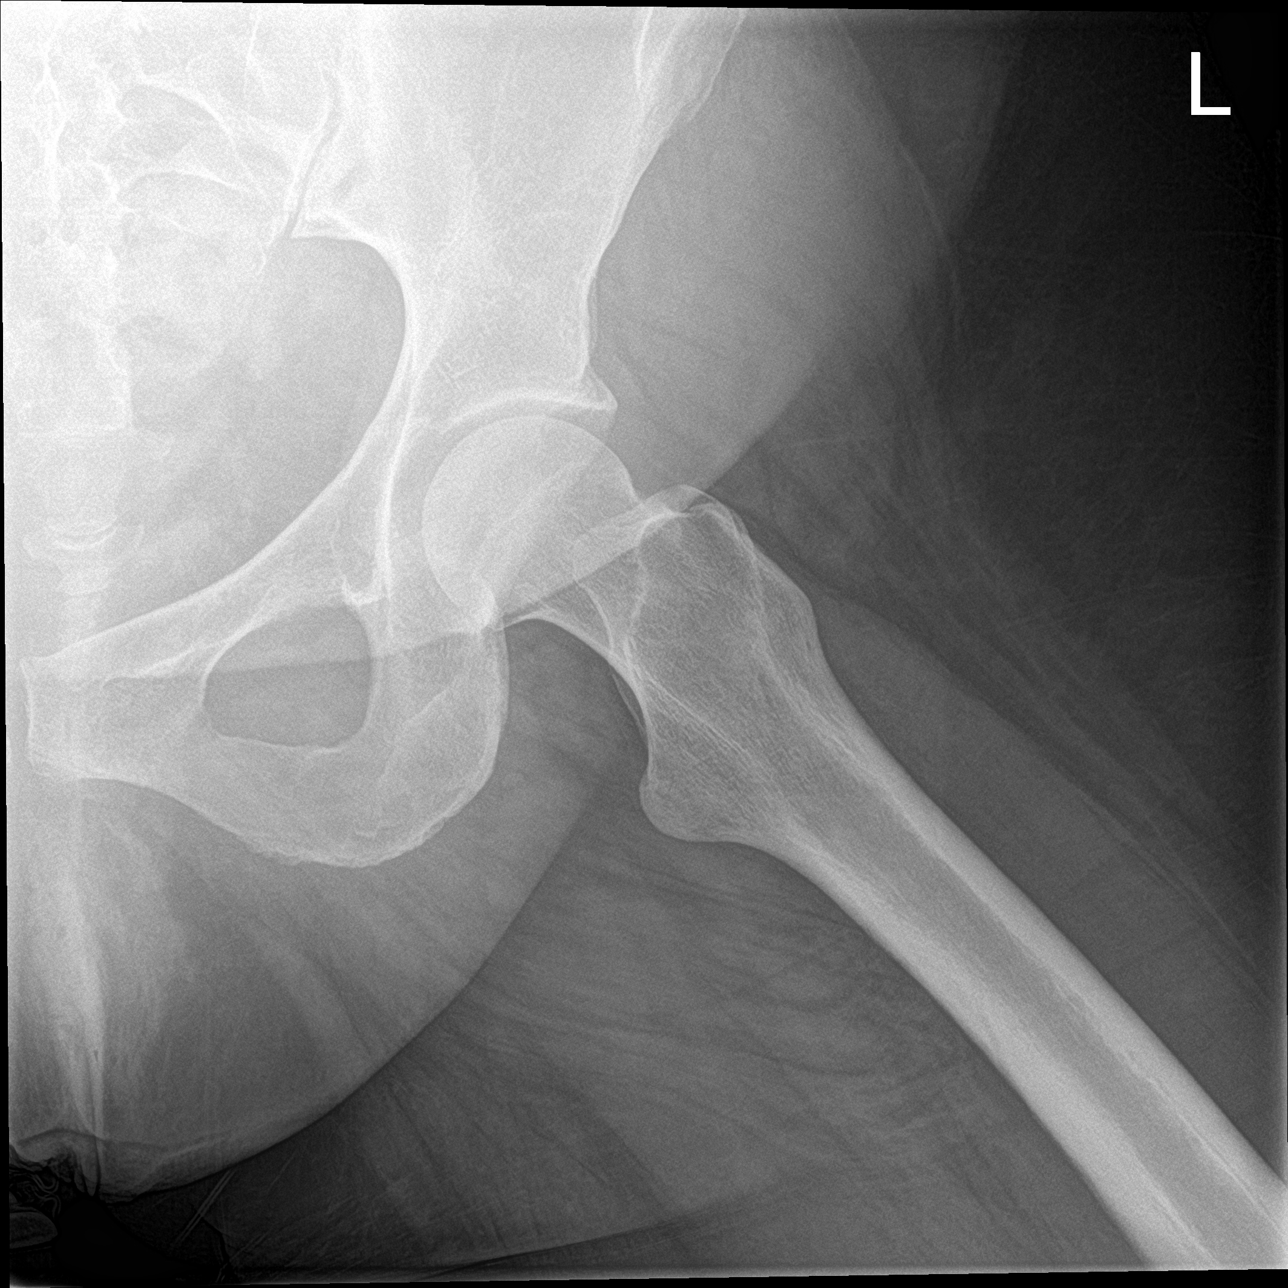

[3 of 3 positions shown; findings below may reference images not displayed]

FINDINGS: There is no evidence of hip fracture or dislocation. There is no
evidence of arthropathy or other focal bone abnormality.
IMPRESSION: Negative.

## 2024-03-13 ENCOUNTER — Encounter: Payer: MEDICAID | Admitting: Gastroenterology

## 2024-03-16 ENCOUNTER — Ambulatory Visit: Payer: MEDICAID | Admitting: Medical-Surgical

## 2024-03-21 ENCOUNTER — Ambulatory Visit: Payer: MEDICAID | Admitting: Medical-Surgical

## 2024-03-27 ENCOUNTER — Ambulatory Visit: Payer: MEDICAID | Admitting: Medical-Surgical

## 2024-04-16 ENCOUNTER — Encounter: Admitting: Gastroenterology

## 2024-05-01 ENCOUNTER — Ambulatory Visit: Payer: MEDICAID | Admitting: Diagnostic Neuroimaging
